# Patient Record
Sex: Male | Born: 1945 | Race: White | Hispanic: No | Marital: Married | Smoking: Never smoker
Health system: Southern US, Community
[De-identification: ages and names within clinical notes are randomized; demographics above are authoritative.]

## PROBLEM LIST (undated history)

## (undated) DIAGNOSIS — I2510 Atherosclerotic heart disease of native coronary artery without angina pectoris: Secondary | ICD-10-CM

## (undated) DIAGNOSIS — I10 Essential (primary) hypertension: Secondary | ICD-10-CM

## (undated) DIAGNOSIS — C61 Malignant neoplasm of prostate: Secondary | ICD-10-CM

## (undated) DIAGNOSIS — E785 Hyperlipidemia, unspecified: Secondary | ICD-10-CM

## (undated) DIAGNOSIS — E039 Hypothyroidism, unspecified: Secondary | ICD-10-CM

## (undated) DIAGNOSIS — IMO0002 Reserved for concepts with insufficient information to code with codable children: Secondary | ICD-10-CM

## (undated) HISTORY — PX: CORONARY ARTERY BYPASS GRAFT: SHX141

## (undated) HISTORY — DX: Hyperlipidemia, unspecified: E78.5

## (undated) HISTORY — DX: Malignant neoplasm of prostate: C61

## (undated) HISTORY — DX: Reserved for concepts with insufficient information to code with codable children: IMO0002

## (undated) HISTORY — DX: Hypothyroidism, unspecified: E03.9

## (undated) HISTORY — DX: Atherosclerotic heart disease of native coronary artery without angina pectoris: I25.10

## (undated) HISTORY — PX: PROSTATECTOMY: SHX69

## (undated) HISTORY — DX: Essential (primary) hypertension: I10

---

## 2005-03-03 ENCOUNTER — Ambulatory Visit: Payer: Self-pay | Admitting: Gastroenterology

## 2005-04-20 ENCOUNTER — Ambulatory Visit: Payer: Self-pay | Admitting: Gastroenterology

## 2005-04-20 ENCOUNTER — Ambulatory Visit (HOSPITAL_COMMUNITY): Admission: RE | Admit: 2005-04-20 | Discharge: 2005-04-20 | Payer: Self-pay | Admitting: Gastroenterology

## 2005-05-19 ENCOUNTER — Inpatient Hospital Stay (HOSPITAL_COMMUNITY): Admission: RE | Admit: 2005-05-19 | Discharge: 2005-05-21 | Payer: Self-pay | Admitting: Urology

## 2005-05-19 ENCOUNTER — Encounter (INDEPENDENT_AMBULATORY_CARE_PROVIDER_SITE_OTHER): Payer: Self-pay | Admitting: *Deleted

## 2006-07-06 ENCOUNTER — Ambulatory Visit: Payer: Self-pay | Admitting: Cardiovascular Disease

## 2006-07-06 ENCOUNTER — Inpatient Hospital Stay (HOSPITAL_COMMUNITY): Admission: EM | Admit: 2006-07-06 | Discharge: 2006-07-10 | Payer: Self-pay | Admitting: Emergency Medicine

## 2006-07-26 ENCOUNTER — Ambulatory Visit: Payer: Self-pay | Admitting: Internal Medicine

## 2006-07-27 ENCOUNTER — Ambulatory Visit: Payer: Self-pay

## 2006-07-27 ENCOUNTER — Encounter (HOSPITAL_COMMUNITY): Admission: RE | Admit: 2006-07-27 | Discharge: 2006-10-25 | Payer: Self-pay | Admitting: Internal Medicine

## 2006-07-31 ENCOUNTER — Ambulatory Visit (HOSPITAL_COMMUNITY): Admission: RE | Admit: 2006-07-31 | Discharge: 2006-07-31 | Payer: Self-pay | Admitting: Internal Medicine

## 2006-10-05 ENCOUNTER — Ambulatory Visit: Payer: Self-pay | Admitting: Internal Medicine

## 2006-10-09 ENCOUNTER — Ambulatory Visit: Payer: Self-pay | Admitting: Internal Medicine

## 2007-01-23 ENCOUNTER — Ambulatory Visit: Payer: Self-pay | Admitting: Internal Medicine

## 2007-01-23 LAB — CONVERTED CEMR LAB
ALT: 19 units/L (ref 0–40)
Albumin: 4 g/dL (ref 3.5–5.2)
Alkaline Phosphatase: 56 units/L (ref 39–117)
BUN: 9 mg/dL (ref 6–23)
Bilirubin, Direct: 0.2 mg/dL (ref 0.0–0.3)
CO2: 30 meq/L (ref 19–32)
Calcium: 9.2 mg/dL (ref 8.4–10.5)
Cholesterol: 106 mg/dL (ref 0–200)
Creatinine, Ser: 0.8 mg/dL (ref 0.4–1.5)
GFR calc Af Amer: 128 mL/min
GFR calc non Af Amer: 106 mL/min
Glucose, Bld: 91 mg/dL (ref 70–99)
HDL: 37.1 mg/dL — ABNORMAL LOW (ref >39.0)
LDL Cholesterol: 44 mg/dL (ref 0–99)
Potassium: 4.8 meq/L (ref 3.5–5.1)
Sodium: 140 meq/L (ref 135–145)
Total Bilirubin: 1.2 mg/dL (ref 0.3–1.2)
Triglycerides: 126 mg/dL (ref 0–149)
VLDL: 25 mg/dL (ref 0–40)

## 2007-02-06 ENCOUNTER — Encounter: Payer: Self-pay | Admitting: Internal Medicine

## 2007-02-06 ENCOUNTER — Ambulatory Visit: Payer: Self-pay

## 2007-07-12 IMAGING — CR DG CHEST 1V PORT
1 series · 1 of 1 positions shown · non-contrast
Comparison: 07/07/06.

CLINICAL DATA: Status post CABG.
 PORTABLE CHEST - 1 VIEW - 07/08/06 AT 2052 HOURS:

[view not recorded]
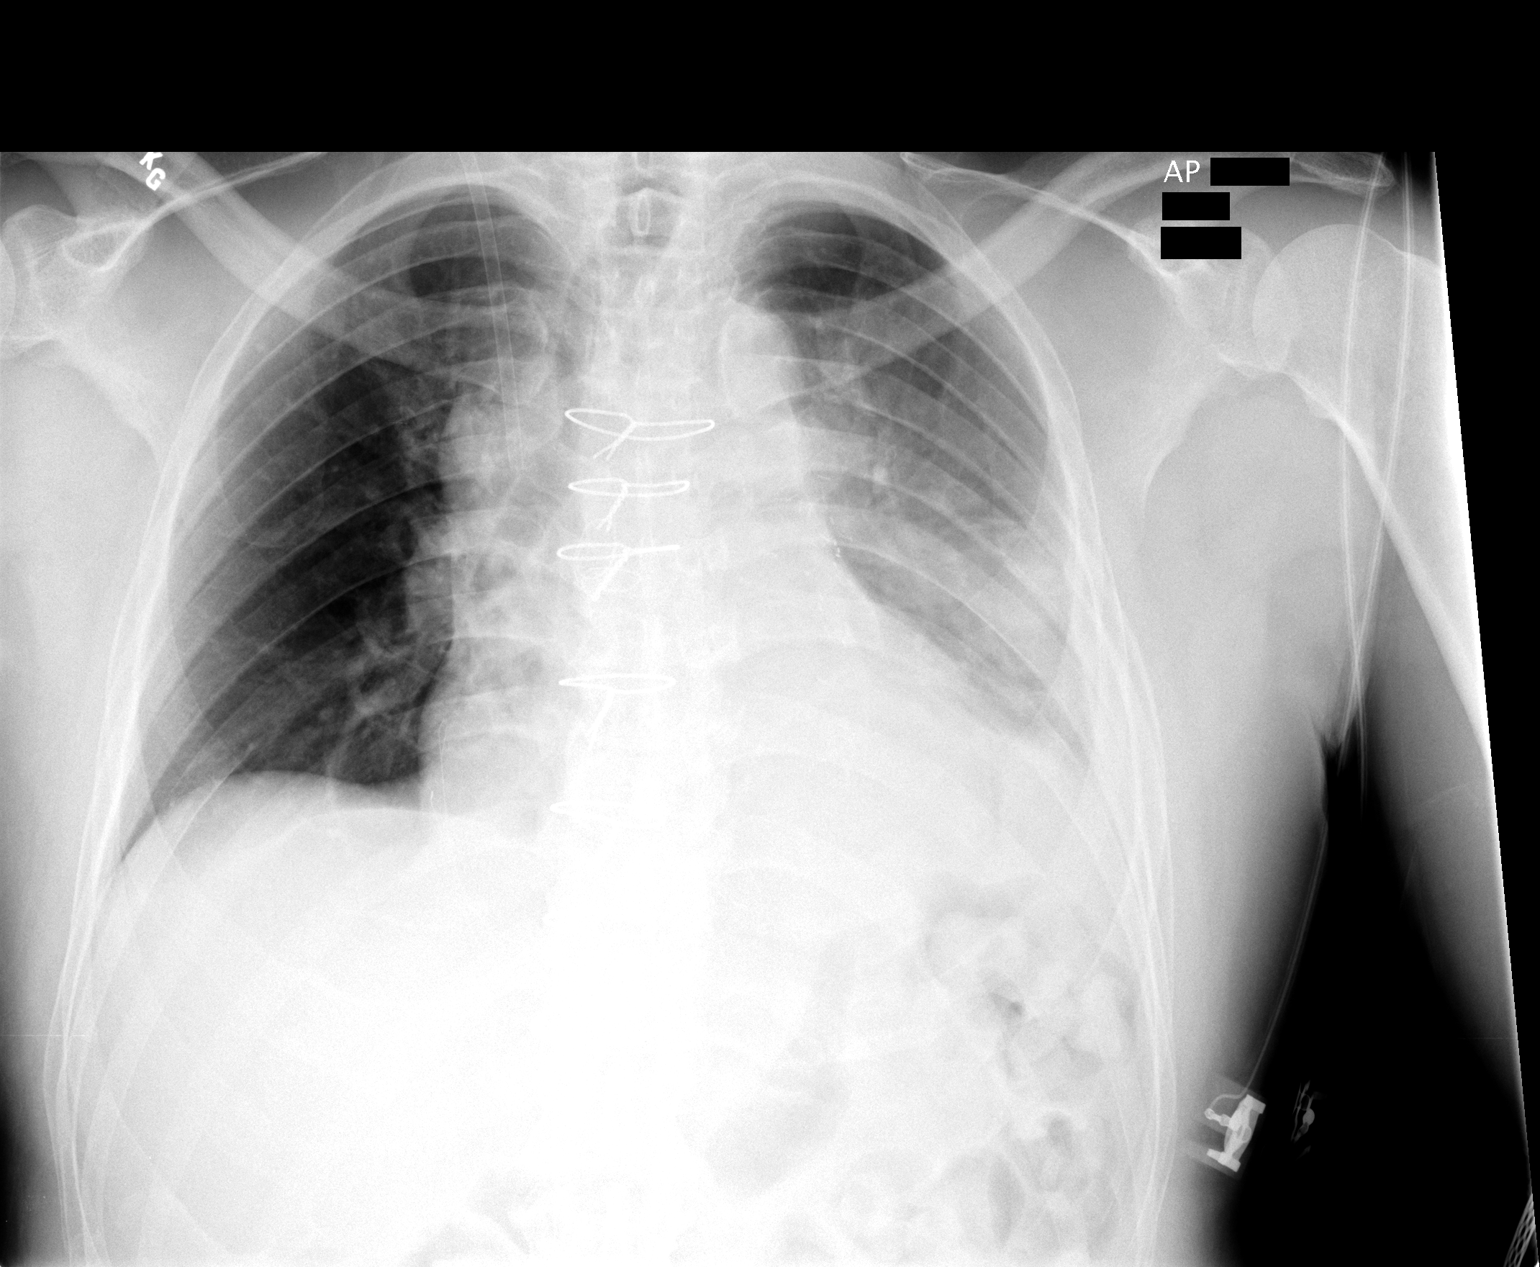

[1 of 1 positions shown; findings below may reference images not displayed]

FINDINGS: The Swan-Ganz catheter, left chest tube, aortic balloon pump, and mediastinal drains have been removed.  No pneumothorax.  Increase in left lower lobe atelectasis since the prior examination.  No edema.  Stable heart size.
IMPRESSION: Increase in left lower lobe atelectasis.  No pneumothorax.

## 2007-08-15 ENCOUNTER — Ambulatory Visit: Payer: Self-pay | Admitting: Internal Medicine

## 2007-08-15 LAB — CONVERTED CEMR LAB
AST: 29 units/L (ref 0–37)
Albumin: 3.8 g/dL (ref 3.5–5.2)
Alkaline Phosphatase: 49 units/L (ref 39–117)
Glucose, Bld: 95 mg/dL (ref 70–99)
Potassium: 4.4 meq/L (ref 3.5–5.1)
Total Bilirubin: 0.8 mg/dL (ref 0.3–1.2)
Triglycerides: 121 mg/dL (ref 0–149)

## 2008-04-13 ENCOUNTER — Encounter: Admission: RE | Admit: 2008-04-13 | Discharge: 2008-04-13 | Payer: Self-pay | Admitting: Family Medicine

## 2008-05-12 ENCOUNTER — Encounter
Admission: RE | Admit: 2008-05-12 | Discharge: 2008-07-07 | Payer: Self-pay | Admitting: Physical Medicine and Rehabilitation

## 2008-08-20 ENCOUNTER — Ambulatory Visit: Payer: Self-pay | Admitting: Internal Medicine

## 2008-08-20 LAB — CONVERTED CEMR LAB
ALT: 33 units/L (ref 0–53)
Alkaline Phosphatase: 45 units/L (ref 39–117)
Bilirubin, Direct: 0.1 mg/dL (ref 0.0–0.3)
CO2: 30 meq/L (ref 19–32)
Calcium: 9.4 mg/dL (ref 8.4–10.5)
Chloride: 108 meq/L (ref 96–112)
Creatinine, Ser: 0.8 mg/dL (ref 0.4–1.5)
GFR calc Af Amer: 127 mL/min
GFR calc non Af Amer: 105 mL/min
Glucose, Bld: 101 mg/dL — ABNORMAL HIGH (ref 70–99)
HDL: 31.3 mg/dL — ABNORMAL LOW (ref >39.0)
Potassium: 4.7 meq/L (ref 3.5–5.1)
Sodium: 142 meq/L (ref 135–145)
Total Protein: 6.9 g/dL (ref 6.0–8.3)
VLDL: 13 mg/dL (ref 0–40)

## 2009-04-27 ENCOUNTER — Encounter (INDEPENDENT_AMBULATORY_CARE_PROVIDER_SITE_OTHER): Payer: Self-pay

## 2009-04-27 ENCOUNTER — Encounter: Payer: Self-pay | Admitting: Internal Medicine

## 2009-04-27 LAB — CONVERTED CEMR LAB
HDL: 45 mg/dL
Triglyceride fasting, serum: 55 mg/dL

## 2009-05-05 ENCOUNTER — Encounter (INDEPENDENT_AMBULATORY_CARE_PROVIDER_SITE_OTHER): Payer: Self-pay

## 2009-07-21 ENCOUNTER — Ambulatory Visit: Payer: Self-pay | Admitting: Internal Medicine

## 2009-07-21 DIAGNOSIS — E782 Mixed hyperlipidemia: Secondary | ICD-10-CM | POA: Insufficient documentation

## 2009-07-21 DIAGNOSIS — I10 Essential (primary) hypertension: Secondary | ICD-10-CM | POA: Insufficient documentation

## 2009-07-21 DIAGNOSIS — R634 Abnormal weight loss: Secondary | ICD-10-CM

## 2009-07-21 DIAGNOSIS — I2510 Atherosclerotic heart disease of native coronary artery without angina pectoris: Secondary | ICD-10-CM

## 2009-10-02 ENCOUNTER — Telehealth: Payer: Self-pay | Admitting: Internal Medicine

## 2010-06-17 ENCOUNTER — Encounter: Payer: Self-pay | Admitting: Internal Medicine

## 2010-06-28 ENCOUNTER — Encounter: Payer: Self-pay | Admitting: Internal Medicine

## 2010-07-14 ENCOUNTER — Ambulatory Visit: Payer: Self-pay | Admitting: Internal Medicine

## 2010-07-24 ENCOUNTER — Encounter: Admission: RE | Admit: 2010-07-24 | Discharge: 2010-07-24 | Payer: Self-pay | Admitting: Family Medicine

## 2010-08-03 ENCOUNTER — Encounter (INDEPENDENT_AMBULATORY_CARE_PROVIDER_SITE_OTHER): Payer: Self-pay | Admitting: *Deleted

## 2010-08-06 ENCOUNTER — Ambulatory Visit: Payer: Self-pay | Admitting: Gastroenterology

## 2010-08-20 ENCOUNTER — Ambulatory Visit: Payer: Self-pay | Admitting: Gastroenterology

## 2010-11-15 ENCOUNTER — Telehealth: Payer: Self-pay | Admitting: Internal Medicine

## 2011-01-02 ENCOUNTER — Encounter: Payer: Self-pay | Admitting: Surgery

## 2011-01-11 NOTE — Assessment & Plan Note (Signed)
Summary: f1y  Medications Added SIMVASTATIN 20 MG TABS (SIMVASTATIN) Take one tablet by mouth daily at bedtime      Allergies Added: NKDA  Visit Type:  Follow-up Primary Provider:  Dr Vernon Prey  CC:  no complaints.  History of Present Illness: [name] [name] is a 65 year old male with history of coronary artery disease status post bypass surgery in 2007.  He also has a history of hypertension, hyperlipidemia, hypothyroidism, and a history of prostate cancer.   He returns today for a routine followup. Overall, he is doing very well.  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  He works very hard with no chest pain or shortness of breath.  However he stopped his walking program and gianed 10 pounds. He does note some occasional soreness in his chest like an arthritis-type pain, this is chronic.  main problem is neck pain. Had MRI which showed cervical disks. He has been taking all his medications without difficulty.   Cholesterol in may TC 111 HDL 47 LDL 42 TG 111  Current Medications (verified): 1)  Carvedilol 3.125 Mg Tabs (Carvedilol) .... Take One Tablet By Mouth Twice A Day 2)  Lisinopril 10 Mg Tabs (Lisinopril) .... Take One Tablet By Mouth Daily 3)  Synthroid 50 Mcg Tabs (Levothyroxine Sodium) .... Take 1 Tablet By Mouth Once A Day 4)  Simvastatin 40 Mg Tabs (Simvastatin) .... Take 1/2 Tablet By Mouth Daily At Bedtime 5)  Niaspan 1000 Mg Cr-Tabs (Niacin (Antihyperlipidemic)) .... Take 1 Tablet By Mouth At Bedtime 6)  Aspirin Ec 325 Mg Tbec (Aspirin) .... Take One Tablet By Mouth Daily  Allergies (verified): No Known Drug Allergies  Past History:  Past Medical History: CAD s/p CABG 2007 HTN Hyperlipidemia Prostate CA s/p resection Hypothyroidism Cervical disk disease  Review of Systems       As per HPI and past medical history; otherwise all systems negative.   Vital Signs:  Patient profile:   65 year old male Height:      64  inches Weight:      148 pounds BMI:     25.50 Pulse rate:   60 / minute BP sitting:   126 / 68  (right arm) Cuff size:   regular  Vitals Entered By: Hardin Negus, RMA (July 14, 2010 4:17 PM)  Physical Exam  General:  Slight build. Well appearing. no resp difficulty HEENT: normal Neck: supple. no JVD. Carotids 2+ bilat; notbruits. No lymphadenopathy or thryomegaly appreciated. Cor: PMI nondisplaced. Regular rate & rhythm. No rubs, gallops, murmur. Lungs: clear Abdomen: soft, nontender, nondistended.Peri Jefferson bowel sounds. Extremities: no cyanosis, clubbing, rash, edema Neuro: alert & orientedx3, cranial nerves grossly intact. moves all 4 extremities w/o difficulty. affect pleasant    Impression & Recommendations:  Problem # 1:  CORONARY ATHEROSCLEROSIS NATIVE CORONARY ARTERY (ICD-414.01) Stable. No evidence of ischemia. Continue current regimen. Encouraged him to restart his walking program.   Problem # 2:  HYPERTENSION, BENIGN (ICD-401.1) Blood pressure well controlled. Continue current regimen.  Problem # 3:  HYPERLIPIDEMIA, MIXED (ICD-272.2) Lipids look great. LDL at goal. We discussed the AIM-HIGH trial with Niaspan and lack of apparent cardiac benefit. At this point, he prefers to keep taking it.   Other Orders: EKG w/ Interpretation (93000)  Patient Instructions: 1)  Your physician wants you to follow-up in: 1 year. You will receive a reminder letter in the mail two months in advance. If you don't receive a letter, please call our  office to schedule the follow-up appointment. Prescriptions: NIASPAN 1000 MG CR-TABS (NIACIN (ANTIHYPERLIPIDEMIC)) Take 1 tablet by mouth at bedtime  #90 x 3   Entered by:   Meredith Staggers, RN   Authorized by:   Dolores Patty, MD, [facility]   Signed by:   Meredith Staggers, RN on 07/14/2010   Method used:   Faxed to ...       [facility] (mail-order)             , [place]         Ph:        Fax: [phone]   RxID:   [id number] LISINOPRIL  10 MG TABS (LISINOPRIL) Take one tablet by mouth daily  #90 x 3   Entered by:   Meredith Staggers, RN   Authorized by:   Dolores Patty, MD, [facility]   Signed by:   Meredith Staggers, RN on 07/14/2010   Method used:   Faxed to ...       [facility] (mail-order)             , [place]         Ph:        Fax: [phone]   RxID:   [id number] CARVEDILOL 3.125 MG TABS (CARVEDILOL) Take one tablet by mouth twice a day  #180 x 3   Entered by:   Meredith Staggers, RN   Authorized by:   Dolores Patty, MD, [facility]   Signed by:   Meredith Staggers, RN on 07/14/2010   Method used:   Faxed to ...       [facility] (mail-order)             , [place]         Ph:        Fax: [phone]   RxID:   [id number] SIMVASTATIN 20 MG TABS (SIMVASTATIN) Take one tablet by mouth daily at bedtime  #90 x 3   Entered by:   Meredith Staggers, RN   Authorized by:   Dolores Patty, MD, [facility]   Signed by:   Meredith Staggers, RN on 07/14/2010   Method used:   Faxed to ...       [facility] (mail-order)             , [place]         Ph:        Fax: [phone]   RxID:   [id number]

## 2011-01-11 NOTE — Procedures (Signed)
Summary: Colonoscopy  Patient: [name] [name] Note: All result statuses are Final unless otherwise noted.  Tests: (1) Colonoscopy (COL)   COL Colonoscopy           DONE     [facility]     [address]     [place], [place]  [address]           COLONOSCOPY PROCEDURE REPORT           PATIENT:  [name] [name], [name] [name]  MR#:  [id number]     BIRTHDATE:  06-01-1946, 63 yrs. old  GENDER:  male           ENDOSCOPIST:  Barbette Hair. Arlyce Dice, MD     Referred by:           PROCEDURE DATE:  08/20/2010     PROCEDURE:  Diagnostic Colonoscopy     ASA CLASS:  Class II     INDICATIONS:  1) Routine Risk Screening           MEDICATIONS:   Fentanyl 75 mcg IV, Versed 8 mg IV           DESCRIPTION OF PROCEDURE:   After the risks benefits and     alternatives of the procedure were thoroughly explained, informed     consent was obtained.  Digital rectal exam was performed and     revealed no abnormalities.   The LB [id number] endoscope     was introduced through the anus and advanced to the cecum, which     was identified by the ileocecal valve, without limitations.  The     quality of the prep was excellent, using MoviPrep.  The instrument     was then slowly withdrawn as the colon was fully examined.     <<PROCEDUREIMAGES>>           FINDINGS:  Mild diverticulosis was found in the sigmoid colon (see     image12).  This was otherwise a normal examination of the colon     (see image2, image3, image4, image5, image6, image8, image10,     image14, and image15).   Retroflexed views in the rectum revealed     no abnormalities.    The time to cecum =  2.75  minutes. The scope     was then withdrawn (time =  6.25  min) from the patient and the     procedure completed.           COMPLICATIONS:  None           ENDOSCOPIC IMPRESSION:     1) Mild diverticulosis in the sigmoid colon     2) Otherwise normal examination     RECOMMENDATIONS:     1) Continue current colorectal screening recommendations  for     "routine risk" patients with a repeat colonoscopy in 10 years.           REPEAT EXAM:  In 10 year(s) for Colonoscopy.           ______________________________     Barbette Hair. Arlyce Dice, MD           CC: Rudi Heap, MD           n.     Rosalie Doctor:   Barbette Hair. Kaplan at 08/20/2010 10:36 AM           Cherre Robins, [id number]  Note: An exclamation mark (!) indicates a result that was not dispersed into the flowsheet. Document Creation Date:  08/20/2010 10:37 AM _______________________________________________________________________  (1) Order result status: Final Collection or observation date-time: 08/20/2010 10:32 Requested date-time:  Receipt date-time:  Reported date-time:  Referring Physician:   Ordering Physician: Melvia Heaps [id number]) Specimen Source:  Source: Launa Grill Order Number: [id number] Lab site:   Appended Document: Colonoscopy    Clinical Lists Changes  Observations: Added new observation of COLONNXTDUE: 08/2020 (08/20/2010 13:16)

## 2011-01-11 NOTE — Miscellaneous (Signed)
Summary: DIRECT COLON SCREEN/Previsit prep/RM  Clinical Lists Changes  Medications: Added new medication of MOVIPREP 100 GM  SOLR (PEG-KCL-NACL-NASULF-NA ASC-C) As per prep instructions. - Signed Rx of MOVIPREP 100 GM  SOLR (PEG-KCL-NACL-NASULF-NA ASC-C) As per prep instructions.;  #1 x 0;  Signed;  Entered by: Sherren Kerns RN;  Authorized by: Louis Meckel MD;  Method used: [facility] to [facility] [phone]*, [address] [place], [place], [place]  [address], Ph: [phone] or [phone], Fax: [phone] Observations: Added new observation of ALLERGY REV: Done (08/06/2010 14:36)    Prescriptions: MOVIPREP 100 GM  SOLR (PEG-KCL-NACL-NASULF-NA ASC-C) As per prep instructions.  #1 x 0   Entered by:   Sherren Kerns RN   Authorized by:   Louis Meckel MD   Signed by:   Sherren Kerns RN on 08/06/2010   Method used:   Electronically to        [facility] [phone]* (retail)       [address]       [place], [place]  [address]       Ph: [phone] or [phone]       Fax: [phone]   RxID:   [id number]

## 2011-01-11 NOTE — Letter (Signed)
Summary: Custom - Lipid  [facility], Main Office  [address]   [place], [place] [address]   Phone: [phone]  Fax: [phone]     June 28, 2010 MRN: [id number]   [name] [name] [address] [place], [place]  [address]   Dear Mr. [name] [name],  We have reviewed your cholesterol results.  They are as follows:     Total Cholesterol:    111 (Desirable: less than 200)       HDL  Cholesterol:     47  (Desirable: greater than 40 for men and 50 for women)       LDL Cholesterol:       42  (Desirable: less than 100 for low risk and less than 70 for moderate to high risk)       Triglycerides:       111  (Desirable: less than 150)  Our recommendations include:  Looks good, continue current medications.   Call our office at the number listed above if you have any questions.  Lowering your LDL cholesterol is important, but it is only one of a large number of "risk factors" that may indicate that you are at risk for heart disease, stroke or other complications of hardening of the arteries.  Other risk factors include:   A.  Cigarette Smoking* B.  High Blood Pressure* C.  Obesity* D.   Low HDL Cholesterol (see yours above)* E.   Diabetes Mellitus (higher risk if your is uncontrolled) F.  Family history of premature heart disease G.  Previous history of stroke or cardiovascular disease    *These are risk factors YOU HAVE CONTROL OVER.  For more information, visit .  There is now evidence that lowering the TOTAL CHOLESTEROL AND LDL CHOLESTEROL can reduce the risk of heart disease.  The American Heart Association recommends the following guidelines for the treatment of elevated cholesterol:  1.  If there is now current heart disease and less than two risk factors, TOTAL CHOLESTEROL should be less than 200 and LDL CHOLESTEROL should be less than 100. 2.  If there is current heart disease or two or more risk factors, TOTAL CHOLESTEROL should be less than 200 and LDL CHOLESTEROL  should be less than 70.  A diet low in cholesterol, saturated fat, and calories is the cornerstone of treatment for elevated cholesterol.  Cessation of smoking and exercise are also important in the management of elevated cholesterol and preventing vascular disease.  Studies have shown that 30 to 60 minutes of physical activity most days can help lower blood pressure, lower cholesterol, and keep your weight at a healthy level.  Drug therapy is used when cholesterol levels do not respond to therapeutic lifestyle changes (smoking cessation, diet, and exercise) and remains unacceptably high.  If medication is started, it is important to have you levels checked periodically to evaluate the need for further treatment options.  Thank you,    [employer] Team

## 2011-01-11 NOTE — Progress Notes (Signed)
Summary: rx refill   Phone Note Refill Request Call back at Home Phone [phone] Message from:  Patient on November 15, 2010 3:14 PM  Refills Requested: Medication #1:  LISINOPRIL 10 MG TABS Take one tablet by mouth daily [facility] [phone]   Method Requested: Telephone to Pharmacy Initial call taken by: Roe Coombs,  November 15, 2010 3:15 PM    Prescriptions: LISINOPRIL 10 MG TABS (LISINOPRIL) Take one tablet by mouth daily  #90 x 3   Entered by:   Hardin Negus, RMA   Authorized by:   Dolores Patty, MD, [facility]   Signed by:   Hardin Negus, RMA on 11/16/2010   Method used:   Faxed to ...       [facility] (mail-order)             , [place]         Ph:        Fax: [phone]   RxID:   [id number]

## 2011-01-11 NOTE — Letter (Signed)
Summary: [facility] Instructions  [facility]  [address] [place], [place] [address]   Phone: [phone]  Fax: [phone]       RUSHTON EARLY    05-25-1960    MRN: [id number]        Procedure Day Dorna Bloom:  Farrell Ours  08/20/10     Arrival Time:  9:00AM     Procedure Time:  10:00AM     Location of Procedure:                    _ X_  [facility] (4th Floor)                       PREPARATION FOR COLONOSCOPY WITH MOVIPREP   Starting 5 days prior to your procedure 9/4/11do not eat nuts, seeds, popcorn, corn, beans, peas,  salads, or any raw vegetables.  Do not take any fiber supplements (e.g. Metamucil, Citrucel, and Benefiber).  THE DAY BEFORE YOUR PROCEDURE         DATE: 08/19/10   DAY: THURSDAY  1.  Drink clear liquids the entire day-NO SOLID FOOD  2.  Do not drink anything colored red or purple.  Avoid juices with pulp.  No orange juice.  3.  Drink at least 64 oz. (8 glasses) of fluid/clear liquids during the day to prevent dehydration and help the prep work efficiently.  CLEAR LIQUIDS INCLUDE: Water Jello Ice Popsicles Tea (sugar ok, no milk/cream) Powdered fruit flavored drinks Coffee (sugar ok, no milk/cream) Gatorade Juice: apple, white grape, white cranberry  Lemonade Clear bullion, consomm, broth Carbonated beverages (any kind) Strained chicken noodle soup Hard Candy                             4.  In the morning, mix first dose of MoviPrep solution:    Empty 1 Pouch A and 1 Pouch B into the disposable container    Add lukewarm drinking water to the top line of the container. Mix to dissolve    Refrigerate (mixed solution should be used within 24 hrs)  5.  Begin drinking the prep at 5:00 p.m. The MoviPrep container is divided by 4 marks.   Every 15 minutes drink the solution down to the next mark (approximately 8 oz) until the full liter is complete.   6.  Follow completed prep with 16 oz of clear liquid of your choice (Nothing red  or purple).  Continue to drink clear liquids until bedtime.  7.  Before going to bed, mix second dose of MoviPrep solution:    Empty 1 Pouch A and 1 Pouch B into the disposable container    Add lukewarm drinking water to the top line of the container. Mix to dissolve    Refrigerate  THE DAY OF YOUR PROCEDURE      DATE: 08/20/10  DAY: FRIDAY  Beginning at 5:00AM (5 hours before procedure):         1. Every 15 minutes, drink the solution down to the next mark (approx 8 oz) until the full liter is complete.  2. Follow completed prep with 16 oz. of clear liquid of your choice.    3. You may drink clear liquids until 8:00AM (2 HOURS BEFORE PROCEDURE).   MEDICATION INSTRUCTIONS  Unless otherwise instructed, you should take regular prescription medications with a small sip of water   as early as possible the morning of  your procedure.   Additional medication instructions: n/a         OTHER INSTRUCTIONS  You will need a responsible adult at least 63 years of age to accompany you and drive you home.   This person must remain in the waiting room during your procedure.  Wear loose fitting clothing that is easily removed.  Leave jewelry and other valuables at home.  However, you may wish to bring a book to read or  an iPod/MP3 player to listen to music as you wait for your procedure to start.  Remove all body piercing jewelry and leave at home.  Total time from sign-in until discharge is approximately 2-3 hours.  You should go home directly after your procedure and rest.  You can resume normal activities the  day after your procedure.  The day of your procedure you should not:   Drive   Make legal decisions   Operate machinery   Drink alcohol   Return to work  You will receive specific instructions about eating, activities and medications before you leave.    The above instructions have been reviewed and explained to me by   Sherren Kerns RN  August 06, 2010 3:15  PM    I fully understand and can verbalize these instructions _____________________________ Date _________

## 2011-04-26 NOTE — Assessment & Plan Note (Signed)
[facility]                            CARDIOLOGY OFFICE NOTE   NAME:[name], [name] BUTLER                        MRN:          [id number]  DATE:08/15/2007                            DOB:          1946-08-22    PRIMARY CARE Teasia Zapf:  Lindaann Pascal at [facility].   INTERVAL HISTORY:  [name] [name] is a 65 year old male with a history of  coronary artery disease status post bypass surgery in July 2007.  He  also has a history of hypertension, hyperlipidemia, hypothyroidism, and  a history of prostate cancer.  He returns today for routine followup.  He is doing well.  He works hard at his job without any chest pain or  shortness of breath.  He is walking about a mile to a mile and a half a  day, and also riding his bike several miles with his grand kids.  He has  had some soreness at his sternotomy site when he tries to throw a  baseball.  He feels like he just cannot stretch it out, but he has not  had any angina, and no heart failure symptoms.  He denies any syncope or  presyncope.   CURRENT MEDICATIONS:  1. Levoxyl 50 mcg a day.  2. Simvastatin 40 a day.  3. Aspirin 81.  4. Lisinopril 10 a day.  5. Metoprolol 25 b.i.d.   PHYSICAL EXAMINATION:  He is well appearing, in no acute distress.  He  ambulates around the clinic without any respiratory difficulty.  Blood pressure is 136/76.  Heart rate is 54.  Weight is 153.  HEENT:  Normal.  NECK:  Supple.  No JVD.  Carotids are 2+ bilaterally without any bruits.  There is no lymphadenopathy or thyromegaly.  CARDIAC:  PMI is non-displaced.  He is bradycardic and regular.  No  murmurs, rubs, or gallops.  LUNGS:  Clear.  ABDOMEN:  Soft, nontender and non-distended.  No hepatosplenomegaly.  No  bruits.  No masses.  Good bowel sounds.  EXTREMITIES:  Warm with no cyanosis, clubbing, or edema.  Good pulses.  No rash.  NEUROLOGIC:  Alert and oriented x3.  Cranial nerves 2 through 12 are  intact.  He moves all 4 extremities without difficulty.  Affect is  mildly flattened.  EKG:  Shows sinus bradycardia, rate of 52.  No significant ST-T wave  abnormalities.   ASSESSMENT AND PLAN:  1. Coronary artery disease status post bypass.  He is doing well.  No      evidence of angina.  He does have some soreness at his sternotomy      site, which is chronic.  2. Hypertension.  Blood pressure is elevated today.  Given his      bradycardia, we will switch his metoprolol over to Coreg 3.125      b.i.d. to see if we can get some more blood pressure to less      bradycardia.  We may also need to consider increasing his      lisinopril.  He can follow up with his primary care physician.  3. Hyperlipidemia.  Check lipids today.  4. Bradycardia.  This is stable.  It is asymptomatic.  We are      switching him over to Coreg.  We may need to discontinue his beta      blocker all together in the future should he become symptomatic.   DISPOSITION:  Return to clinic in 9 months for routine followup.  We  will check lipids today.     Bevelyn Buckles. Bensimhon, MD  Electronically Signed    DRB/MedQ  DD: 08/15/2007  DT: 08/15/2007  Job #: [id number]

## 2011-04-26 NOTE — Assessment & Plan Note (Signed)
[facility]                            CARDIOLOGY OFFICE NOTE   NAME:Albertsen, ABID BOLLA                        MRN:          [id number]  DATE:08/20/2008                            DOB:          [birth year]    PRIMARY CARE PHYSICIAN:  Lindaann Pascal, PA-C at [facility].   INTERVAL HISTORY:  [name] [name] is a 65 year old male with history of coronary  artery disease status post bypass surgery in 2007.  He also has a  history of hypertension, hyperlipidemia, hypothyroidism, and a history  of prostate cancer.   He returns today for a routine followup.  Overall, he is doing very  well.  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  He does note  some occasional soreness in his chest like an arthritis-type pain, this  is chronic.  He has been taking all his medications without difficulty.   REVIEW OF SYSTEMS:  Negative except as above.   CURRENT MEDICATIONS:  1. Levoxyl 50 mcg day.  2. Simvastatin 40 a day.  3. Aspirin 81.  4. Lisinopril 10 a day.  5. Coreg 3.125 b.i.d.  6. Niaspan 500 at bedtime.   PHYSICAL EXAMINATION:  GENERAL:  He is well appearing, in no acute  distress, ambulatory around the clinic, without any respiratory  difficulty.  VITAL SIGNS:  Blood pressure is 122/80, heart rate is 58, weight is 145.  HEENT:  Normal.  NECK:  Supple.  No JVD.  Carotids are 2+ bilaterally without any bruits.  There is no lymphadenopathy or thyromegaly.  CARDIAC:  PMI is nondisplaced.  He is bradycardic and regular.  No  murmurs, rubs, or gallops.  LUNGS:  Clear.  ABDOMEN:  Soft, nontender, and nondistended.  No hepatosplenomegaly, no  bruits, no masses.  Good bowel sounds.  EXTREMITIES:  Warm with no cyanosis, clubbing or edema.  No rash.  NEURO:  Alert and oriented x3.  Cranial nerves II-XII intact.  Moves all  4 extremities without difficulty.  Affect is  normal.   EKG shows sinus bradycardia at a rate of 58.  No ST-T wave  abnormalities.   ASSESSMENT AND PLAN:  1. Coronary artery disease status post bypass surgery.  He is doing      well.  No evidence of angina.  He does have some soreness of his      sternotomy site, which is chronic, likely related to his surgery.  2. Hypertension.  Blood pressure well controlled.  3. Hyperlipidemia.  He does have low HDL.  We will continue statin and      Niaspan.  Get a lipid panel today.  Goal LDL is 70.  We may need      also titrate his Niaspan up if his HDL remains low.     Bevelyn Buckles. Bensimhon, MD  Electronically Signed    DRB/MedQ  DD: 08/20/2008  DT: 08/21/2008  Job #: [id number]

## 2011-04-29 NOTE — Assessment & Plan Note (Signed)
[facility]                                 ON-CALL NOTE   NAME:[name], [name]                          MRN:          [id number]  DATE:03/16/2007                            DOB:          08-25-1946    PRIMARY CARDIOLOGIST:  Dr. Clydie Braun.   I received a telephone call through the answering service from Cherre Robins at [phone], I promptly returned the call and spoke with [name]. He was calling regarding high blood pressure. He states he had  just seen Dr. Theotis Burrow on February 12th for a followup visit, blood  pressure was stable at that time at 120/76. Patient was taking  lisinopril 10 mg a day and metoprolol 50 mg b.i.d., apparently Dr.  Theotis Burrow has had the patient cut his metoprolol back to 25 mg b.i.d. and  was to follow up patient in 9 months or sooner if he had any problems.  Patient called on March 15, 2007 at 18:27 stated his blood pressure was  170/100 with a heart rate of 76. [name] [name] states he had been nauseated  and had headache prior to checking his blood pressure and was concerned  that it was elevated. I recommended that he increase his metoprolol back  to the 50 mg b.i.d. and follow up with Dr. Theotis Burrow in the morning, to  make an appointment for reevaluation sooner than 9 months out. [name] [name]  states he would and verbalized understanding of the medication change.     Dorian Pod, ACNP  Electronically Signed    MB/MedQ  DD: 03/16/2007  DT: 03/16/2007  Job #: [id number]

## 2011-04-29 NOTE — Op Note (Signed)
NAMECAVIN, LONGMAN                 ACCOUNT NO.:  [id number]   MEDICAL RECORD NO.:  [id number]          PATIENT TYPE:  INP   LOCATION:  0005                         FACILITY:  [facility]   PHYSICIAN:  Excell Seltzer. Annabell Howells, M.D.    DATE OF BIRTH:  October 09, 1946   DATE OF PROCEDURE:  05/19/2005  DATE OF DISCHARGE:                                 OPERATIVE REPORT   PREOPERATIVE DIAGNOSIS:  Prostate cancer.   POSTOPERATIVE DIAGNOSIS:  Prostate cancer.   PROCEDURE:  Radical prostatectomy with bilateral pelvic lymphadenectomy.   SURGEON:  Excell Seltzer. Annabell Howells, M.D.   ASSISTANT:  Dr. Bailey Mech   ANESTHESIA:  General.   SPECIMEN:  Prostate seminal vesicles, bilateral pelvic lymph nodes.   ESTIMATED BLOOD LOSS:  1000 mL.   DRAINS:  A 20 French Foley catheter and #10 flat, Blake drain.   COMPLICATIONS:  None.   INDICATIONS:  Mr. Timko is a 65 year old white male who was found to have a  nodular prostate and marginal PSA who underwent a prostate ultrasound biopsy  which demonstrated 5% of the left tissue with Gleason 6 adenocarcinoma of  the prostate.  Only 1 of the 6 cores was positive.  He did have a nodule on  the right side of the prostate gland.  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 He has selected radical  prostatectomy.   FINDINGS AND PROCEDURE:  The patient is given a gram of Ancef. He was fitted  with PASOs.  He was taken to the operating room where a general anesthetic  was induced.  His lower abdomen was shaved and prepped with Betadine  solution. He was draped in the usual sterile fashion.  A Foley catheter was  inserted and the bladder was drained.  A lower midline incision was made  with the knife from the pubis to approximately half-way to the umbilicus.  This was carried down through the subcutaneous and musculofascial layers  with the Bovie.  The Bookwalter retractor was placed.  The right and left  pelvic fossa were exposed. A malleable  retractor was placed on the right and  the lymph node dissection was performed with a limited dissection being the  external iliac vein, the circumflex iliac vein, the obturator nerve and the  bifurcation of the iliac arteries.  Hemolock clips were used to control the  vascular and lymphatic channels.   A left pelvic lymph node dissection was then performed in an identical  fashion without complications.  At this point the retractors were  repositioned and the prostatectomy was performed.  The endopelvic fascia was  opened on each side with the tips of the scissors and then lateral  dissection of the prostate was performed bluntly.  The endopelvic fascia was  then elevated on a right-angle on the right and incised proximally toward  the bladder.  This was then repeated on the left.  The Hohenfellner clamp  was then placed beneath the dorsal vein complex.  A #1 Vicryl tie was then  brought around and tied.  An Allis clamp  was used to grasp the edges of the  endopelvic fascia and compress them over the prostate. This was then  oversewn using a 2-0 Vicryl figure-of-eight suture.  At this point the  [facility] was replaced and the dorsal vein complex was divided with a  cautery.  There was some moderate bleeding from the dorsal vein complex that  eventually required multiple figure-of-eight 2-0 Vicryl sutures to  adequately control.   Once this was controlled the neurovascular bundles were dissected off of the  urethra.  A moistened umbilical tape was placed beneath the urethra.  The  anterior urethral wall was divided using a knife on long handle, and the  Foley was then pulled up into the wound, divided and used to apply cephalad  traction and the posterior urethral wall was then divided with scissors.  Gentle dissection was performed to elevate the prostate off the rectal wall  and the lateral pedicles were then taken down using right-angle clips with  care taken to allow the  neurovascular bundle to fall away.  Once the  prostate was elevated sufficiently, the __________ fascia was incised over  the seminal vesicles and ampullae.  The ampullae were dissected out, and  divided after placement of Hemolock clips.  The seminal vesicles were then  dissected out.  Clips were placed across the tips and they were divided.   At this point, we turned our attention to the bladder neck where the bladder  neck was grasped between Allis clamps and opened with the Bovie.  The Foley  catheter balloon was drained and the Foley was brought out from the bladder  neck incision to provide traction on the prostate.  The posterior bladder  neck and lateral attachments were taken down using a tonsil holder for  dissection the Bovie freeing the bladder neck up from the prostate and  seminal vesicles.  Some attention attachments to the seminal vesicles were  then taken down using clips and the specimen was removed from the field.  Inspection revealed some bleeding from a few areas of the bladder neck.  It  was controlled with cautery successfully.  A this point a pack was placed in  the pelvis and bladder neck reconstruction was performed.  The bladder neck  mucosa was everted using interrupted 4-0 chromic sutures and then the  bladder neck was then closed in a tennis racket fashion using a running 2-0  Vicryl stitch.   At this point, the pack was removed from the pelvis.  Hemostasis was felt to  be adequate  A Greenwald sound was placed per urethra and anastomotic  sutures were placed using 2-0 Vicryl.  The sutures were placed at 2, 5, 7,  10, and 12 o'clock.  Once all the sutures were then placed on the urethral  side the Greenwald sound was removed and a fresh Foley catheter, 20-French  was inserted.  The 4 posterior anastomotic sutures were then placed.  The  Foley was then inserted through the bladder neck. Balloon was filled with 15 mL of sterile fluid and the 12 o'clock suture  was placed. The retractors  were released and the bladder neck was brought down to the urethral stump.  The Foley was placed on light traction.  The sutures were tensioned, tied,  and trimmed.   At this point, the bladder was irrigated. No leakage was noted at the  bladder neck.  No blood was noted in the bladder.  A #10 fully flat Blake  drain was placed  through a separate stab wound on the left side of the  incision.  This was secured with a 2-0 silk suture.  The incision was closed  with running #1 PDS.  The subcutaneous tissue was  irrigated. The skin was closed with clips.  A dressing was applied.  The  drain was placed to bulb suction and the Foley was placed to straight  drainage.  The patient's anesthetic was reversed. He was admitted to the  recovery room in stable condition.  There were no complications.       JJW/MEDQ  D:  05/19/2005  T:  05/19/2005  Job:  [id number]   cc:   Lorin Picket A. Gerda Diss, MD  [address], [address]  [place]  [place] [address]  Fax: [phone]

## 2011-04-29 NOTE — Assessment & Plan Note (Signed)
[facility]                            CARDIOLOGY OFFICE NOTE   NAME:Strick, [name] [name]                        MRN:          [id number]  DATE:01/23/2007                            DOB:          07-24-1946    PRIMARY CARE Elaura Calix:  Lindaann Pascal at [facility].   INTERVAL HISTORY:  Mr. Mineer is a very pleasant 65 year old male with a  history of coronary artery disease status post bypass surgery in July of  2007, as well as hypertension, hyperlipidemia, hypothyroidism and  history of prostate cancer.  He returns today for routine follow up.  He  is doing very well.  He works hard at his job without any chest pain or  shortness of breath.  He does continue to walk intermittently.  He  denies any heart failure symptoms.  He does have some numbness at his  sternotomy site.   CURRENT MEDICATIONS:  1. Levoxyl 50 mg a day.  2. Metoprolol 50 b.i.d.  3. Simvastatin 40 a day.  4. Aspirin 81 a day.  5. Lisinopril 10 a day.   PHYSICAL EXAM:  He is well-appearing, no acute distress, ambulatory in  the clinic without any respiratory difficulty.  Blood pressure 120/76,  heart rate is 53, weight is 156.  HEENT:  Sclera anicteric, EOMI.  There are no xanthelasmas.  Mucous  membranes are moist.  Oropharynx clear.  Good dentition.  NECK:  Supple, no JVD.  Carotids are 2+ bilaterally without any bruits.  There is no lymphadenopathy or thyromegaly.  CARDIAC:  He is bradycardic and regular.  No murmurs, rubs or gallops.  LUNGS:  Clear with mildly decreased breath sounds at the left base.  ABDOMEN:  Soft, nontender, nondistended.  No hepatosplenomegaly, no  bruits, no masses.  EXTREMITIES:  Warm with no cyanosis, clubbing or edema.  Good pulses.  NEURO:  Alert and oriented x3, cranial nerves II-XII are intact, moves  all 4 extremities without difficulty.   EKG shows sinus bradycardia with low voltage and no significant ST-T  wave changes.   ASSESSMENT AND PLAN:  1. Coronary artery disease.  This is quite stable.  He is on a good      regimen and we will continue this.  We are clearing him to go back      to work without any restrictions.  2. Hypertension, well controlled.  3. Hyperlipidemia.  Check lipids today.  4. Bradycardia.  Will cut back his metoprolol at 25 b.i.d.  5. Low voltage on EKG.  I suspect this normal for him, but we will get      an echocardiogram just to make sure he does not have a significant      pericardial effusion.   DISPOSITION:  Return to clinic in 9 months for routine follow up.     Bevelyn Buckles. Bensimhon, MD  Electronically Signed    DRB/MedQ  DD: 01/23/2007  DT: 01/23/2007  Job #: [id number]   cc:   Lindaann Pascal

## 2011-04-29 NOTE — Cardiovascular Report (Signed)
NAMEHIAWATHA, [name] [name] NO.:  [id number]   MEDICAL RECORD NO.:  [id number]          PATIENT TYPE:  INP   LOCATION:  2807                         FACILITY:  [facility]   PHYSICIAN:  Micheline Chapman, MD   DATE OF BIRTH:  [birth year]   DATE OF PROCEDURE:  07/06/2006  DATE OF DISCHARGE:                              CARDIAC CATHETERIZATION   PRIMARY CARE Nadya Hopwood:  Lindaann Pascal, P.A. with [facility].   REFERRING PHYSICIAN:  Arvilla Meres, M.D.   This is a cardiac catheterization report, performed by me with Dr. Simona Huh as the proctoring physician.   INDICATIONS FOR PROCEDURE:  [name] [name] is a 65 year old man who presented  with a one month history of crescendo angina that occurred with minimal  exertion.  His symptoms became progressive and he developed resting chest  discomfort that sounded classic for myocardial ischemia and this he was  referred for urgent cardiac catheterization.   PROCEDURES PERFORMED:  Left heart catheterization, selective coronary  angiography, left ventricular angiography and intra-aortic balloon pump  insertion.   DESCRIPTION OF PROCEDURE:  The procedure indications and risks were reviewed  with the patient and consent was obtained.  The patient was prepped and  draped in normal sterile fashion.  The right groin was anesthetized with 1%  Lidocaine and right femoral arterial access was achieved via the modified  Seldinger technique.  A 6 French arterial sheath was placed and #6 [place]  JL4, JR4 and angled pigtail catheters were used.  Following angiographic  images, an intra-aortic balloon pump was inserted due to critical left main  disease and ongoing chest pain.  This was performed by change out over a  wire to a #8 [place] arterial sheath followed by routine introduction of a 34  cm intra-aortic balloon pump without any problems.   FINDINGS:  Opening aortic pressure was 138/82 mmHg.  Left ventricular  systolic pressure was 132 mmHg.  Left ventricular end diastolic pressure was  10 mmHg.   ANGIOGRAPHIC FINDINGS::  The initial images of the left main demonstrate  high grade disease of the distal left main extending into the ostial  circumflex with a 90% ulcerated ostial circumflex lesion.  The remainder of  the left circumflex system is without any significant disease.  There is a  large first marginal as well as an AV groove circumflex that do not show any  obstructive coronary disease.  There is a moderate sized ramus branch that  has high grade ostial disease and appears to be involved in the distal left  mainstem process with an approximate 80% ostial lesion.  The left anterior  descending artery has moderate ostial disease and serial 70% lesions  throughout.  There is a long tubular lesion just proximal to a large second  diagonal branch that is also 70% in severity.  There are two significant  diagonals that branch off of the left anterior descending and they are  without any significant disease.   The right coronary artery is a medium sized vessel with a focal 40% proximal  lesion.  There is no other significant disease in this vessel.  It gives off  two RV marginal branches that are both fairly small and two posterior  lateral branches.  Intracoronary nitroglycerin was given to evaluate the  proximal lesion and it remained in the 30 to 40% range after nitroglycerin.   LEFT VENTRICULOGRAPHY:  The left ventriculography showed a preserved left  ventricular ejection fraction of 65%.   SUMMARY:  This is a 65 year old man with unstable angina and high grade  distal left main disease. He developed ongoing chest discomfort after  performing the left coronary angiogram and we therefore inserted an intra-  aortic balloon pump as described above.  He tolerated this well.  He was  also started on intravenous nitroglycerin and intravenous heparin at that  time and his chest pain resolved.   He had excellent augmentation of his  pressures with the balloon pump.  A cardiovascular surgery consult was  obtained and Dr. Evelene Croon has kindly agreed to evaluate [name] [name] with  plans on operating later today.      Micheline Chapman, MD  Electronically Signed     MDC/MEDQ  D:  07/06/2006  T:  07/06/2006  Job:  [id number]   cc:   Evelene Croon, M.D.  Arvilla Meres, M.D. [facility]  Scott Long, P.A. -[place]

## 2011-04-29 NOTE — Discharge Summary (Signed)
NAMEMATHEAU, ORONA                 ACCOUNT NO.:  [id number]   MEDICAL RECORD NO.:  [id number]          PATIENT TYPE:  INP   LOCATION:  2018                         FACILITY:  [facility]   PHYSICIAN:  Coral Ceo, P.A.     DATE OF BIRTH:  December 04, 1946   DATE OF ADMISSION:  07/06/2006  DATE OF DISCHARGE:                                 DISCHARGE SUMMARY   PRIMARY ADMITTING DIAGNOSIS:  Chest pain.   ADDITIONAL/DISCHARGE DIAGNOSES:  1.  Coronary artery disease with high-grade left main and ostial left      circumflex stenoses with thrombus.  2.  Unstable angina.  3.  Hyperlipidemia.  4.  Hypothyroidism.  5.  History of prostate cancer status post prostatectomy.  6.  Postoperative blood loss anemia.   PROCEDURES PERFORMED:  1.  Cardiac catheterization.  2.  Emergency coronary artery bypass grafting x3 (left internal mammary      artery to the LAD, saphenous vein graft to the diagonal, saphenous vein      graft to the obtuse marginal).  3.  Endoscopic vein harvest left leg.   HISTORY:  The patient is a 65 year old white male with no previous history  of coronary artery disease.  He presented with history of intermittent  exertional chest pain which had been occurring for approximately 1 month.  He feels like the symptoms have been increasing in frequency over the past  few weeks and the intensity of the symptoms has also worsened.  He presented  to [facility] for further evaluation and was seen by [facility]  cardiology and admitted for further workup.   HOSPITAL COURSE:  Mr. Kitagawa underwent cardiac catheterization which showed  high-grade 90% distal left main stenosis extending into the ostium of the  left circumflex.  There is thrombus present in the left main.  The LAD was  heavily diseased in its proximal and midportions with sequential 90%  stenoses.  There was a moderate size diagonal branch.  The left circumflex  had two large marginal branches that communicated.  The right  coronary  artery was a large vessel and there was a 40% nonobstructive stenosis in the  proximal portion.  Left ventricular function was well-preserved.  Because of  high-grade left main stenosis, and presence of thrombus a cardiothoracic  surgery consultation was obtained and the patient was seen by Dr. Evelene Croon.  Dr. Laneta Simmers felt that emergent coronary artery bypass grafting was  indicated to prevent further ischemia or infarction.  The patient had an  intra-aortic balloon pump placed in the cath lab for ongoing chest pain and  chest pain did resolve.  He is taken emergently to the operating room and  underwent CABG x3 as described in detail above.  He tolerated the procedure  well and was transferred to the SICU in stable condition.  Postoperatively  he has done well.  His intra-aortic balloon pump was removed on postop day  #1.  He was extubated shortly after surgery and was remaining  hemodynamically stable on postop day #1 as well.  His chest tubes and  hemodynamic  monitoring devices were removed at that time.  He remained in  the unit for further observation and by postop day #2 was ready for transfer  to the floor.  Overall he has done well postoperatively.  He has been  started on a beta blocker in the dose and titrated upward.  He has remained  mildly tachycardiac and cardiology has advanced his beta-blocker dose to  Lopressor 50 mg b.i.d. at the time of discharge.  He has also had a mild  blood loss anemia which has been stable and has not required treatment.  His  incisions are all healing well.  He has remained afebrile and his other  vital signs have been stable.  On postop day #4 his labs showed a hemoglobin  of 9.6, hematocrit 27.8, platelets 130, white count 10.8, sodium 140,  potassium 4.5, BUN 13, creatinine 0.8.  He is back down to his preoperative  weight and his diuretics have been discontinued.  It is felt that since he  has remained stable and continues to  improve that he will be ready for  discharge home on 07/10/2006.   DISCHARGE MEDICATIONS:  1.  Enteric-coated aspirin 325 mg q.d.  2.  Lopressor 50 mg b.i.d.  3.  Zocor 40 mg q.d.  4.  Levoxyl 50 mcg q.d.  5.  Niferex 150 mg q.d.  6.  Folic acid 1 mg q.d.  7.  Flonase p.r.n.  8.  Oxycodone 1 to 2 q.4-6 h p.r.n. for pain.   DISCHARGE INSTRUCTIONS:  He is asked to refrain from driving, heavy lifting  or strenuous activity.  He may continue ambulating daily and using his  incentive spirometer.  He may shower daily and clean his incisions with soap  and water.  He will continue low-fat, low-sodium diet.   DISCHARGE FOLLOWUP:  He will need to make an appointment see Dr. Gala Romney  in 2 weeks.  He will then follow up with Dr. Laneta Simmers in 3 weeks and our  office will contact him with an appointment.  He will call in the interim if  he experiences any problems or has questions.      Coral Ceo, P.A.     GC/MEDQ  D:  07/10/2006  T:  07/10/2006  Job:  [id number]   cc:   Arvilla Meres, M.D. [facility]  [employer]  [address]  [place]  [place] [address]   Dr. Lindaann Pascal, [place]

## 2011-04-29 NOTE — Assessment & Plan Note (Signed)
[facility]                              CARDIOLOGY OFFICE NOTE   NAME:[name] [name], [name] [name]                        MRN:          [id number]  DATE:10/09/2006                            DOB:          November 14, 1946    PRIMARY CARE Nathanial Arrighi:  Dr. Lindaann Pascal at [facility].   PATIENT IDENTIFICATION:  [name] [name] is a 65 year old man who presents for  routine followup.   PROBLEM LIST:  1. Coronary artery disease.      a.     Cardiac catheterization in 2007 showed 2 vessel disease with       high-grade ostial left main stenosis, now status post bypass surgery       with LIMA to LAD and saphenous vein graft to the diagonal, saphenous       vein graft to the obtuse marginal.  2. Hypertension.  3. Hyperlipidemia.  4. Hypothyroidism.  5. History of prostate cancer status post prostatectomy.   CURRENT MEDICATIONS:  1. Levoxyl 50 mcg once a day.  2. Folic acid.  3. Zocor 40.  4. Metoprolol 50 b.i.d.  5. Aspirin 81 mg a day.   INTERVAL HISTORY:  [name] [name] returns today for routine followup.  He was  doing quite well.  He does have some occasional soreness at his operative  site but no angina, no shortness of breath.  He is interested to know when  he can go back to work.  Blood pressure today was 134/76 with a heart rate  of 56, with a weight of 153.  HEENT:  Sclerae anicteric, EOMi.  There are no xanthelasmas.  Mucous  membranes are moist.  NECK:  Supple, no JVD.  Carotids are 2+ bilaterally without bruit.  There is  no lymphadenopathy or thyromegaly.  CARDIAC:  Irregular rate and rhythm with distant heart sounds, no murmur or  rub.  There is an S4.  LUNGS:  Clear.  ABDOMEN:  Soft, nontender, nondistended.  No hepatosplenomegaly, no bruits,  no masses, good bowel sounds.  EXTREMITIES:  Warm with no cyanosis, clubbing or edema.  There are no rashes  or arthropathies.  NEUROLOGIC:  He is alert and oriented x3.  Cranial nerves II-XII  intact.  He  moves all 4 extremities without difficulty.   ASSESSMENT AND PLAN:  1. Coronary artery disease status post coronary artery bypass grafting.      He is doing quite well.  He is on a good medical regimen.  Continue      current therapy.  2. Hypertension.  Suboptimally controlled.  We will add low dose ACE      inhibitor, lisinopril 10 mg daily.  We will check a BMET on Friday.  3. Hyperlipidemia.  Most recent lipids show a total cholesterol of 119      with LDL 50 and a HDL 36.  LFTs are normal.  Continue Simvastatin.   DISPOSITION:  I have cleared him to go back to work with a 30 pound weight  restriction due to his recent sternotomy.  We will see him back in  4 months  for routine followup.     Bevelyn Buckles. Bensimhon, MD    DRB/MedQ  DD: 10/09/2006  DT: 10/09/2006  Job #: [id number]   cc:   [facility] Dr. Lindaann Pascal, [facility] [place]

## 2011-04-29 NOTE — Assessment & Plan Note (Signed)
[facility]                              CARDIOLOGY OFFICE NOTE   NAME:[name] [name], [name] [name]                        MRN:          [id number]  DATE:07/26/2006                            DOB:          12-Jan-1946    PRIMARY CARE PHYSICIAN:  Dr. Lindaann Pascal in [place].   PATIENT IDENTIFICATION:  [name] [name] is a very pleasant 65 year old male who  presents for his post bypass followup.   PROBLEM LIST:  1. Coronary artery disease.      a.     Cardiac catheterization in July, 2007 for unstable angina showed       two vessel disease with high grade left main stenosis.  He is now       status post bypass surgery with Dr. Laneta Simmers with a LIMA to the LAD,       saphenous vein graft to the diagonal, saphenous vein graft to the       obtuse marginal.  2. Hypertension.  3. Hyperlipidemia.  4. History of prostate cancer, status post prostatectomy.  5. Hypothyroidism.   MEDICATIONS:  1. Levoxyl 50 mcg a day.  2. Folic acid.  3. Zocor 40.  4. Metoprolol 50 b.i.d.  5. Niferex.  6. Aspirin 325 a day.   No known drug allergies.   INTERVAL HISTORY:  [name] [name] returns today for routine followup.  He is  walking on his own and feeling quite well.  He feels he is back to his  previous self.  He does have occasional pain in his upper back and  shoulders, but this is not his anginal pain.  He also feels that he has some  upper airway wheezing but no significant respiratory distress.  He has not  had any heart failure symptoms.  He does complain of some painful swelling  around his vein harvest site in his left leg.  He has not had fevers or  chills.   PHYSICAL EXAMINATION:  VITAL SIGNS:  Blood pressure 116/82, heart rate 79.  Weight is 151.  GENERAL:  He is well-appearing in no acute distress.  Respirations are  unlabored.  HEENT:  Sclerae are anicteric.  EOMI.  There is no xanthelasma.  Mucous  membranes are moist.  NECK:  Supple.  There is no JVD.  Carotids are 2+  bilaterally with no  bruits.  No lymphadenopathy or thyromegaly.  His sternum is stable.  LUNGS:  Clear.  CARDIAC:  He has a regular rate and rhythm with no murmur or rub.  There is  an S4.  ABDOMEN:  Soft, nontender, nondistended.  No hepatosplenomegaly.  No bruits.  No masses.  EXTREMITIES:  Warm with no clubbing, cyanosis or edema.  On his left leg  near the site of his vein graft harvest, he does have a significant  ecchymosis and small area of painful swelling, which is tender to the touch.  There is no surrounding erythema.  NEUROLOGIC:  He is alert and oriented x3.  Cranial nerves II-XII are intact.  He moves all four extremities without difficulty.   EKG shows a  normal sinus rhythm with nonspecific T wave abnormalities, rate  of 79.   ASSESSMENT/PLAN:  1. Coronary artery disease, status post coronary artery bypass graft:  He      is doing quite well.  He is on a good medical regimen.  He will start      cardiac rehab tomorrow.  He will follow up with CVTS shortly.  Given      his job, he will apply for a three month family medical leave act due      to his requirement for heavy lifting, which he cannot perform at this      time.  2. Left lower extremity swelling:  I suspect this might be just a seroma      or small hematoma; however, I do think it is reasonable to get an      ultrasound to rule out a DVT, which will be done tomorrow.  I told him      he can start using hot compresses to help it resolve.  Should it get      worse, he will need to follow up with surgery.   DISPOSITION:  We will see him back for followup in three months.                                Bevelyn Buckles. Bensimhon, MD    DRB/MedQ  DD:  07/26/2006  DT:  07/26/2006  Job #:  [id number]   cc:   Dr. Beryle Quant, MD

## 2011-04-29 NOTE — H&P (Signed)
NAMESANJUAN, SAWA                 ACCOUNT NO.:  [id number]   MEDICAL RECORD NO.:  [id number]          PATIENT TYPE:  INP   LOCATION:  0005                         FACILITY:  [facility]   PHYSICIAN:  Excell Seltzer. Annabell Howells, M.D.    DATE OF BIRTH:  [birth year]   DATE OF ADMISSION:  05/19/2005  DATE OF DISCHARGE:                                HISTORY & PHYSICAL   CHIEF COMPLAINT:  I have prostate cancer.   HISTORY:  Mr. Catoe is a 65 year old white male who was found to have a  prostate nodule on recent exam for microhematuria.  His PSA had reached a  high of 2.3 in April 2003, but in March 2005, it was 1.8.  He was found to  have a Gleason 6 adenocarcinoma of the prostate involving 5% of the prostate  tissue.  A full review of the options available to the patient was made, and  he elected radical prostatectomy.   ALLERGIES:  No known drug allergies.   CURRENT MEDICATIONS:  Levoxyl 50 mcg daily.   PAST MEDICAL HISTORY:  Hypothyroidism.   PAST SURGICAL HISTORY:  Pertinent for prostate biopsy.   SOCIAL HISTORY:  Negative for tobacco or alcohol.  He works for [employer].   FAMILY HISTORY:  Pertinent for diabetes.   REVIEW OF SYSTEMS:  He is entirely without complaints.   PHYSICAL EXAMINATION:  VITAL SIGNS:  Blood pressure 137/88, pulse 68,  temperature 97, respirations 16.  GENERAL:  He is a well-developed, well-nourished, white male in no acute  distress, alert and oriented x3.  HEENT:  Head normocephalic/atraumatic.  NECK:  Supple without thyromegaly or bruit.  LYMPH:  No cervical, axillary, or inguinal adenopathy.  LUNGS:  Clear.  HEART:  Regular rate and rhythm.  ABDOMEN:  Soft, flat, nontender, without masses, hepatosplenomegaly, or CVA  tenderness.  GU EXAM:  Reveals an unremarkable phallus, adequate meatus, scrotum  unremarkable, testicles bilaterally descended, normal without masses or  tenderness.  Epididymides unremarkable.  Anus and perineum without lesions.  RECTAL EXAM:  Normal  sphincter tone.  Prostate is 1+ in size with slight  nodularity in the right lateral aspect about 0.5 cm in size.  Seminal  vesicles are nonpalpable.  No rectal masses are noted.  EXTREMITIES:  Full range of motion without edema.  NEUROLOGICAL:  Grossly intact.  SKIN:  Warm and dry.   IMPRESSION:  1.  Gleason 6, T2a, adenocarcinoma of the prostate.  2.  History of microhematuria.   PLAN:  He will undergo prostatectomy today.       JJW/MEDQ  D:  05/19/2005  T:  05/19/2005  Job:  [id number]   cc:   Lindaann Pascal, M.D.

## 2011-04-29 NOTE — H&P (Signed)
NAMESHANTEL, WESELY NO.:  [id number]   MEDICAL RECORD NO.:  [id number]          PATIENT TYPE:  INP   LOCATION:  1824                         FACILITY:  [facility]   PHYSICIAN:  Arvilla Meres, M.D. LHCDATE OF BIRTH:  [birth year]   DATE OF ADMISSION:  07/06/2006  DATE OF DISCHARGE:                                HISTORY & PHYSICAL   PRIMARY CARE Aveen Stansel:  Lindaann Pascal, P.A.-C., at [facility].   PRIMARY CARDIOLOGIST:  Is new will be Dr. Gala Romney.   CHIEF COMPLAINT:  Chest pain.   HISTORY OF PRESENT ILLNESS:  [name] [name] is a 65 year old white male with no  previous history of coronary artery disease.  He complains of approximately  a 54-month history of chest pain described as a fullness or pressure.  It  is exertional in nature.  It is associated with shortness of breath and  nausea, but he denies diaphoresis.  The other day, he was out doing yard  work with a weed eater, and it reached an 8 or 9/10.  It was relieved by  rest in less than 5 minutes.  He has not had any symptoms that started at  rest.  He states that he was walking on flat ground, and after about 500  feet, his symptoms started, and other than that, it has been with yard work  and other exertional activities.  He feels like the symptoms have been  increasing in frequency over the last couple of weeks, and he also feels  that the harder the exertion the higher intensity of the symptoms.  He is  currently pain free.   PAST MEDICAL HISTORY:  He denies any history of diabetes or hypertension but  states he has a history of hyperlipidemia which he is trying to control with  diet as well as family history of premature coronary artery disease.  He has  a history of hypothyroidism that was diagnosed after he had increasing  fatigue, and he also has a history of prostate cancer for which she had  surgery.   SURGICAL HISTORY:  He is status post prostatectomy in 2006 as well as  tonsillectomy.   ALLERGIES:  No known drug allergies.   CURRENT MEDICATIONS:  1.  Levoxyl 50 mcg daily.  2.  Aspirin 81 mg a day.  3.  Flonase b.i.d. p.r.n.   SOCIAL HISTORY:  Lives in [place] with his wife and works as a Clinical cytogeneticist at [employer].  He denies any history of alcohol, tobacco or drug  abuse.   FAMILY HISTORY:  His mother is alive in her 74s without a history of heart  disease.  His father is alive at age 70 and has had heart disease that  started when he was in his 63s.  He has one brother who had a heart attack  when he was in his 63s.   REVIEW OF SYSTEMS:  He has had some problems with constipation.  He denies  any reflux symptoms.  He denies hematemesis, hemoptysis or melena.  He has  had no recent weight changes.  He  denies depression or anxiety.  He has had  some arthralgias and has had right forearm and wrist pain that is worse when  he first wakes up and in the morning, and he notices that he has trouble  holding a coffee cup first thing in the morning.  He has some symptoms  during the rest of the day but not as bad.  His TSH is followed by Lindaann Pascal.  Review of systems is otherwise negative.   PHYSICAL EXAMINATION:  VITAL SIGNS:  Blood pressure 157/93, heart rate 72,  respiratory rate 20, O2 saturation 99% on room air.  GENERAL:  He is a well-developed, well-nourished white male in no acute  distress.  HEENT:  His head is normocephalic and atraumatic with pupils equal, round,  and reactive to light and accommodation.  Extraocular movements intact.  Sclera clear.  Nares without discharge.  NECK:  There is no lymphadenopathy, thyromegaly, bruit or JVD noted.  CARDIOVASCULAR:  His heart is regular in rate and rhythm with a S1-S2 and a  soft systolic murmur is noted at the left lower sternal border.  His distal  pulses are 2+ in all four extremities, and no femoral bruits are  appreciated.  LUNGS:  Clear to auscultation bilaterally.  SKIN:  No  rashes or lesions are noted.  ABDOMEN:  Soft and nontender with active bowel sounds, and no splenomegaly  is noted by palpation.  EXTREMITIES:  There is no cyanosis, clubbing or edema noted.  MUSCULOSKELETAL:  There is no joint deformity or effusions and no spine or  CVA tenderness.  NEUROLOGICAL:  He is alert and oriented.  Cranial nerves II-XII grossly  intact.   STUDIES:  Chest x-ray and laboratory values are pending at the time of  dictation.   EKG is sinus rhythm, rate 72 beats per minute, with no acute ischemic  changes; no pathologic and Q-waves.   IMPRESSION:  Chest pain.  His symptoms are concerning for unstable anginal  pain.  He has cardiac risk factors that include age, gender, family history  and hyperlipidemia.  It is felt that cardiac catheterization is indicated to  further define his anatomy and guide treatment.  Dr. Nicholes Mango saw the  patient and discussed the risks and benefits of the cath with the patient  and his wife.  He is agreeable to proceeding.  We will add aspirin to his  medication regimen as well as a low dose beta blocker.  We will obtain the  results of the blood work done at [facility], and if  his cath is positive for coronary artery disease, he will be started on  empiric statin.   Again, this is [name] [name], P.A.-C., dictating for Dr. Nicholes Mango who  saw the patient and determined the plan of care.      [name] [name], P.A. [facility]      Arvilla Meres, M.D. [facility]  Electronically Signed    RB/MEDQ  D:  07/06/2006  T:  07/06/2006  Job:  [id number]

## 2011-04-29 NOTE — Op Note (Signed)
NAMESALVATORE, SHEAR                 ACCOUNT NO.:  [id number]   MEDICAL RECORD NO.:  [id number]          PATIENT TYPE:  INP   LOCATION:  2303                         FACILITY:  [facility]   PHYSICIAN:  Evelene Croon, M.D.     DATE OF BIRTH:  1946-10-22   DATE OF PROCEDURE:  07/06/2006  DATE OF DISCHARGE:                                 OPERATIVE REPORT   PREOPERATIVE DIAGNOSIS:  High-grade left main and ostial left circumflex  stenosis with thrombus present and unstable angina.   POSTOPERATIVE DIAGNOSIS:  High-grade left main and ostial left circumflex  stenosis with thrombus present and unstable angina.   OPERATIVE PROCEDURE:  Emergency median sternotomy, extracorporeal  circulation, coronary artery bypass graft surgery x3 using a left internal  mammary artery graft to the left anterior descending coronary artery, with a  saphenous vein graft to the diagonal branch of the LAD and a saphenous vein  graft to the obtuse marginal branch of the left circumflex coronary artery.  Endoscopic vein harvesting from the left leg.   ATTENDING SURGEON:  Evelene Croon, M.D.   ASSISTANT:  Jerold Coombe, P.A.   ANESTHESIA:  General endotracheal.   CLINICAL HISTORY:  This patient is a 65 year old gentleman who presented  with unstable anginal symptoms.  Cardiac catheterization was performed  urgently and showed high-grade 90% distal left main stenosis extending into  the ostium of the left circumflex.  There is thrombus present in the left  main.  The LAD was heavily diseased in its proximal and mid portions with  sequential 90% stenoses.  There was a moderate-sized diagonal branch.  The  left circumflex had two large marginal branches that communicated.  The  right coronary artery was a large vessel.  There was about 40%  nonobstructive disease in the proximal portion.  Left ventricular function  was well-preserved.  After review of the angiogram and examination of the  patient, it was felt that  emergent coronary bypass graft surgery was  indicated to prevent further ischemia and infarction and sudden death.  The  patient had an intra-aortic balloon pump placed in the cath lab by  cardiology for ongoing chest pain, which resolved this chest pain.  I  discussed the operative procedure with the patient including alternatives,  benefits, and risks including bleeding, blood transfusion, infection,  stroke, myocardial infarction, graft failure, and death.  He understood and  agreed to proceed.   OPERATIVE PROCEDURE:  The patient was taken to the operating room and placed  on the table in the supine position.  After induction of general  endotracheal anesthesia, a Foley catheter was placed in the bladder using  sterile technique.  Then the chest, abdomen, and both lower extremities were  prepped and draped in the usual sterile manner.  The chest was entered  through a median sternotomy incision and the pericardium was in the midline.  Examination of the heart showed good ventricular contractility.  The  ascending aorta had no palpable plaques in it.   Then the left internal mammary artery was harvested from the chest wall as  pedicle  graft.  This was a medium caliber vessel with excellent blood  pressure.  At the same time, a segment of greater saphenous vein was  harvested from the left leg using endoscopic vein harvest technique.  This  vein was of medium caliber and fair quality.  The walls were somewhat  thickened.   Then the patient was heparinized and when an adequate __________  was  achieved, the distal ascending aorta was cannulated using a 20-French aortic  cannula for arterial inflow.  Venous outflow was achieved using a two-stage  venous cannula to the right atrial appendage.  An antegrade cardioplegia and  vent cannula was inserted in the aortic root.   The patient was placed on cardiopulmonary bypass and distal coronaries  identified.  The LAD was a large graftable  vessel and it was heavily  diseased in its proximal and mid portions.  The diagonal branch was a  moderate-sized vessel that had no distal disease in it.  The first marginal  branch was a large vessel that was intramyocardial.  It was located at its  mid portion just on the surface of the muscle where it was a large graftable  vessel.  The right coronary artery was visible from its proximal portion  down to the posterior descending branch and no visible plaque was seen or  palpated.   Then the aorta was cross-clamped and 500 cubic centimeters of cold blood  antegrade cardioplegia was administered in the aortic root for quick arrest  of the heart.  Systemic hypothermia to 20 degrees centigrade and topical  hypothermic dressing was used.  Temperature probe was placed in septum and  insulating pad in the pericardium.   The first distal anastomosis was performed to the obtuse marginal branch.  The internal diameter of this vessel was about 2.5 mm.  The conduit used was  a segment of greater saphenous vein, the anastomosis formed in an end-to-  side manner with continuous 7-0 Prolene suture.  Flow was noted through the  graft and was excellent.   Second distal anastomosis was formed to the diagonal branch.  The internal  diameter of this vessel was about 1.6 mm.  The conduit used was a second  segment of greater saphenous vein, the anastomosis formed in an end-to-side  manner using continuous 7-0 Prolene suture.  Flow was noted through the  graft and was excellent.  Then a dose of cardioplegia was given down vein  grafts and the aortic root.   The third distal anastomosis was performed in the distal LAD.  The internal  diameter was about 2 mm.  The conduit used was a left internal mammary graft  and was brought through an opening in left pericardium anterior and phrenic  nerve.  Anastomoses in the LAD in an end-to-side manner using continuous 8-0 Prolene suture.  The pedicle was sutured to  the epicardium with 6-0 Prolene  sutures.  The patient re-warmed to 37 degrees centigrade.  With a crossclamp  in place, the two proximal vein graft anastomoses were performed in the  aortic root end-to-side manner using continuous 6-0 Prolene suture.  Then  the clamp was removed from the mammary pedicle.  There was rapid warming of  the ventricular septum and return of spontaneous ventricular fibrillation.  The crossclamp was removed with a time of 51 minutes, and the patient  spontaneously converted to sinus rhythm.   The proximal and distal anastomoses appeared hemostatic __________  the  graft satisfactory.  Graft markers were  placed around the proximal  anastomoses.  Two temporary right ventricular and right atrial pacing wires  were placed __________  the skin.   The patient had re-warmed to 37 degrees centigrade.  He was weaned from  cardiopulmonary bypass __________ .  Total bypass time was 63 minutes.  Cardiac function appeared excellent with cardiac output of 4 liters a  minute.  Protamine was given, and the venous and aortic catheters were  removed without difficulty.  Hemostasis was achieved.  Three chest tubes  were placed with two in the post pericardium, one in the left pleural space,  and one in the anterior mediastinum.  The pericardium was loosely  reapproximated over the heart.  The sternum was closed with #6 stainless  steel wire.  The fascia was closed with continuous 1-Vicryl suture.  Subcutaneous  tissue was closed with continuous 2-0 Vicryl and the skin with  3-0 Vicryl subcuticular closure.  The lower extremity vein harvest site was  closed in layers in a similar manner.  Sponge, needle, and instrument counts  were correct  __________ .  Dry sterile dressings were applied over the incisions around  the chest tubes which __________ Pleur-Evac suction.  The patient remained  hemodynamically stable and was transferred to the SICU in guarded but stable   condition.      Evelene Croon, M.D.  Electronically Signed     BB/MEDQ  D:  07/06/2006  T:  07/06/2006  Job:  [id number]   cc:   [facility]  Cardiac Catheterization Lab

## 2011-06-24 ENCOUNTER — Other Ambulatory Visit: Payer: Self-pay | Admitting: Internal Medicine

## 2011-07-17 ENCOUNTER — Other Ambulatory Visit: Payer: Self-pay | Admitting: Internal Medicine

## 2011-08-23 ENCOUNTER — Other Ambulatory Visit: Payer: Self-pay | Admitting: Internal Medicine

## 2011-10-27 ENCOUNTER — Other Ambulatory Visit: Payer: Self-pay | Admitting: Internal Medicine

## 2012-01-02 ENCOUNTER — Other Ambulatory Visit: Payer: Self-pay | Admitting: Internal Medicine

## 2012-02-13 ENCOUNTER — Telehealth: Payer: Self-pay | Admitting: Cardiology

## 2012-02-13 NOTE — Telephone Encounter (Signed)
Pt is a former pt of bensimhon, made fu appt with hochrein in [place], not seen 1 1/2 yrs, will he want blood work?

## 2012-02-13 NOTE — Telephone Encounter (Signed)
Left message for pt that I don't know if MD will require lab work from him as we do not know if he has had any recent labs at his PCP.  He may need a fasting lipid and basic metabolic panel due to treatment of hyperlipidemia and ace inhibitor use.  Requested pt call back to further discuss.

## 2012-02-18 ENCOUNTER — Other Ambulatory Visit: Payer: Self-pay | Admitting: Internal Medicine

## 2012-02-26 ENCOUNTER — Other Ambulatory Visit (HOSPITAL_COMMUNITY): Payer: Self-pay | Admitting: Internal Medicine

## 2012-03-12 ENCOUNTER — Other Ambulatory Visit: Payer: Self-pay | Admitting: Cardiology

## 2012-03-21 ENCOUNTER — Ambulatory Visit (INDEPENDENT_AMBULATORY_CARE_PROVIDER_SITE_OTHER): Payer: BC Managed Care – PPO | Admitting: Cardiology

## 2012-03-21 ENCOUNTER — Encounter: Payer: Self-pay | Admitting: Cardiology

## 2012-03-21 VITALS — BP 129/80 | HR 92 | Ht 64.0 in | Wt 153.0 lb

## 2012-03-21 DIAGNOSIS — I10 Essential (primary) hypertension: Secondary | ICD-10-CM

## 2012-03-21 DIAGNOSIS — I2510 Atherosclerotic heart disease of native coronary artery without angina pectoris: Secondary | ICD-10-CM

## 2012-03-21 DIAGNOSIS — E782 Mixed hyperlipidemia: Secondary | ICD-10-CM

## 2012-03-21 DIAGNOSIS — E785 Hyperlipidemia, unspecified: Secondary | ICD-10-CM | POA: Insufficient documentation

## 2012-03-21 MED ORDER — ASPIRIN 81 MG PO TABS: 325.0000 mg | ORAL_TABLET | Freq: Every day | ORAL | Status: DC

## 2012-03-21 NOTE — Progress Notes (Signed)
   HPI The patient presents for followup of known coronary disease. He was previously seen Dr. Gala [name].  He has a history of CABG but has been doing well. He denies any of the symptoms that was his previous angina. The patient denies any new symptoms such as chest discomfort, neck or arm discomfort. There has been no new shortness of breath, PND or orthopnea. There have been no reported palpitations, presyncope or syncope. He works an active and vigorous job without symptoms.  No Known Allergies  Current Outpatient Prescriptions  Medication Sig Dispense Refill  . aspirin 325 MG tablet Take 325 mg by mouth daily.      . carvedilol (COREG) 3.125 MG tablet TAKE 1 TABLET TWICE A DAY  25 tablet  0  . levothyroxine (SYNTHROID, LEVOTHROID) 50 MCG tablet Take 50 mcg by mouth daily.      Marland Kitchen lisinopril (PRINIVIL,ZESTRIL) 10 MG tablet TAKE 1 TABLET DAILY (NEED TO CALL TO MAKE FOLLOW UP APPOINTMENT)  90 tablet  1  . NIASPAN 1000 MG CR tablet TAKE 1 TABLET AT BEDTIME (OVERDUE FOR FOLLOW UP APPOINTMENT WITH DR BENSIMHON)  90 tablet  0  . simvastatin (ZOCOR) 20 MG tablet TAKE 1 TABLET DAILY AT BEDTIME  90 tablet  2    Past Medical History  Diagnosis Date  . CAD (coronary artery disease)   . HTN (hypertension)   . Hyperlipidemia   . Hypothyroidism   . Prostate cancer   . Degenerative disc disease     Past Surgical History  Procedure Date  . Coronary artery bypass graft     LIMA to the LAD, SVG to diagonal, SVG to obtuse marginal.2007 Dr. Laneta Simmers   . Prostatectomy     ROS:  As stated in the HPI and negative for all other systems.  PHYSICAL EXAM BP 129/80  Pulse 92  Ht 5\' 4"  (1.626 m)  Wt 153 lb (69.4 kg)  BMI 26.26 kg/m2 GENERAL:  Well appearing HEENT:  Pupils equal round and reactive, fundi not visualized, oral mucosa unremarkable NECK:  No jugular venous distention, waveform within normal limits, carotid upstroke brisk and symmetric, no bruits, no thyromegaly LYMPHATICS:  No cervical,  inguinal adenopathy LUNGS:  Clear to auscultation bilaterally BACK:  No CVA tenderness CHEST:  Well healed sternotomy scar. HEART:  PMI not displaced or sustained,S1 and S2 within normal limits, no S3, no S4, no clicks, no rubs, no murmurs ABD:  Flat, positive bowel sounds normal in frequency in pitch, no bruits, no rebound, no guarding, no midline pulsatile mass, no hepatomegaly, no splenomegaly EXT:  2 plus pulses throughout, no edema, no cyanosis no clubbing SKIN:  No rashes no nodules NEURO:  Cranial nerves II through XII grossly intact, motor grossly intact throughout [facility]:  Cognitively intact, oriented to person place and time  EKG:  Sinus rhythm, rate 92, axis within normal limits, intervals within normal limits, no acute ST-T wave changes, baseline artifact 03/21/2012  ASSESSMENT AND PLAN

## 2012-03-21 NOTE — Assessment & Plan Note (Signed)
The patient could not afford his Niaspan. He stopped taking it about one week prior to the most recent lipid profile. I reviewed this and risk profile demonstrated an HDL of 58 and an LDL of 60. I would like him to continue on at least 500 mg extended release over-the-counter. We will get lipid and liver studies in 8 weeks.

## 2012-03-21 NOTE — Assessment & Plan Note (Signed)
The blood pressure is at target. No change in medications is indicated. We will continue with therapeutic lifestyle changes (TLC).  

## 2012-03-21 NOTE — Assessment & Plan Note (Addendum)
The patient has no new sypmtoms.  No further cardiovascular testing is indicated.  We will continue with aggressive risk reduction and meds as listed. He can reduce his ASA to 81 mg.

## 2012-03-21 NOTE — Patient Instructions (Addendum)
Please stop your Niaspan and take Niacin ER 500 mg over the counter Decrease your Asa to 81 mg a day Continue your other medications as listed.  Please have fasting blood work in 6 weeks. (lipid and liver profile, hand-written order given)  Please follow up with Dr Antoine Poche in 6 months.  You will receive a letter in the mail 2 months before you are due.  Please call us when you receive this letter to schedule your follow up appointment.

## 2012-05-29 ENCOUNTER — Encounter: Payer: Self-pay | Admitting: Cardiology

## 2012-08-22 ENCOUNTER — Ambulatory Visit (INDEPENDENT_AMBULATORY_CARE_PROVIDER_SITE_OTHER): Payer: BC Managed Care – PPO | Admitting: General Surgery

## 2012-09-07 ENCOUNTER — Encounter: Payer: Self-pay | Admitting: Cardiology

## 2013-09-24 ENCOUNTER — Ambulatory Visit (INDEPENDENT_AMBULATORY_CARE_PROVIDER_SITE_OTHER): Payer: BC Managed Care – PPO | Admitting: Family Medicine

## 2013-09-24 VITALS — BP 138/84 | HR 68 | Temp 97.8°F | Ht 64.0 in | Wt 152.0 lb

## 2013-09-24 DIAGNOSIS — R079 Chest pain, unspecified: Secondary | ICD-10-CM

## 2013-09-24 NOTE — Progress Notes (Signed)
  Subjective:    Patient ID: [name] [name], male    DOB: 1946/09/16, 67 y.o.   MRN: [id number]  HPI  This 67 y.o. male presents for evaluation of chest pain.  Patient has hx of CAD and he has  Called his cardiologist and has an appointment in November.  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  He has been having substernal chest pain.  Review of Systems C/o chest pain No  SOB, HA, dizziness, vision change, N/V, diarrhea, constipation, dysuria, urinary urgency or frequency, myalgias, arthralgias or rash.     Objective:   Physical Exam  Vital signs noted  Well developed well nourished male.  HEENT - Head atraumatic Normocephalic                Eyes - PERRLA, Conjuctiva - clear Sclera- Clear EOMI                Ears - EAC's Wnl TM's Wnl Gross Hearing WNL                Nose - Nares patent                 Throat - oropharanx wnl Respiratory - Lungs CTA bilateral Cardiac - RRR S1 and S2 without murmur. MS - TTP left sternal border and left intercostals.     EKG - NSR without acute ST-T changes and no changes with last EKG in chart. Assessment & Plan:  Chest pain - Plan: EKG 12-Lead  Costochondritis - Naprosyn 500mg  one po bid x 10 days #20.  [name] Pancoast Nyimah Shadduck FNP  [name] Canter FNP

## 2013-09-24 NOTE — Patient Instructions (Signed)
Chest Pain (Nonspecific) °It is often hard to give a specific diagnosis for the cause of chest pain. There is always a chance that your pain could be related to something serious, such as a heart attack or a blood clot in the lungs. You need to follow up with your caregiver for further evaluation. °CAUSES  °· Heartburn. °· Pneumonia or bronchitis. °· Anxiety or stress. °· Inflammation around your heart (pericarditis) or lung (pleuritis or pleurisy). °· A blood clot in the lung. °· A collapsed lung (pneumothorax). It can develop suddenly on its own (spontaneous pneumothorax) or from injury (trauma) to the chest. °· Shingles infection (herpes zoster virus). °The chest wall is composed of bones, muscles, and cartilage. Any of these can be the source of the pain. °· The bones can be bruised by injury. °· The muscles or cartilage can be strained by coughing or overwork. °· The cartilage can be affected by inflammation and become sore (costochondritis). °DIAGNOSIS  °Lab tests or other studies, such as X-rays, electrocardiography, stress testing, or cardiac imaging, may be needed to find the cause of your pain.  °TREATMENT  °· Treatment depends on what may be causing your chest pain. Treatment may include: °· Acid blockers for heartburn. °· Anti-inflammatory medicine. °· Pain medicine for inflammatory conditions. °· Antibiotics if an infection is present. °· You may be advised to change lifestyle habits. This includes stopping smoking and avoiding alcohol, caffeine, and chocolate. °· You may be advised to keep your head raised (elevated) when sleeping. This reduces the chance of acid going backward from your stomach into your esophagus. °· Most of the time, nonspecific chest pain will improve within 2 to 3 days with rest and mild pain medicine. °HOME CARE INSTRUCTIONS  °· If antibiotics were prescribed, take your antibiotics as directed. Finish them even if you start to feel better. °· For the next few days, avoid physical  activities that bring on chest pain. Continue physical activities as directed. °· Do not smoke. °· Avoid drinking alcohol. °· Only take over-the-counter or prescription medicine for pain, discomfort, or fever as directed by your caregiver. °· Follow your caregiver's suggestions for further testing if your chest pain does not go away. °· Keep any follow-up appointments you made. If you do not go to an appointment, you could develop lasting (chronic) problems with pain. If there is any problem keeping an appointment, you must call to reschedule. °SEEK MEDICAL CARE IF:  °· You think you are having problems from the medicine you are taking. Read your medicine instructions carefully. °· Your chest pain does not go away, even after treatment. °· You develop a rash with blisters on your chest. °SEEK IMMEDIATE MEDICAL CARE IF:  °· You have increased chest pain or pain that spreads to your arm, neck, jaw, back, or abdomen. °· You develop shortness of breath, an increasing cough, or you are coughing up blood. °· You have severe back or abdominal pain, feel nauseous, or vomit. °· You develop severe weakness, fainting, or chills. °· You have a fever. °THIS IS AN EMERGENCY. Do not wait to see if the pain will go away. Get medical help at once. Call your local emergency services (911 in [place] Do not drive yourself to the hospital. °MAKE SURE YOU:  °· Understand these instructions. °· Will watch your condition. °· Will get help right away if you are not doing well or get worse. °Document Released: 09/07/2005 Document Revised: 02/20/2012 Document Reviewed: 07/03/2008 °[employer]® Patient Information ©2014 [employer]   [employer]. ° °

## 2013-09-25 ENCOUNTER — Telehealth: Payer: Self-pay | Admitting: Family Medicine

## 2013-09-30 ENCOUNTER — Other Ambulatory Visit: Payer: Self-pay | Admitting: Family Medicine

## 2013-09-30 MED ORDER — NAPROXEN 500 MG PO TABS: 500.0000 mg | ORAL_TABLET | Freq: Two times a day (BID) | ORAL | Status: DC

## 2013-10-18 ENCOUNTER — Other Ambulatory Visit: Payer: Self-pay | Admitting: Nurse Practitioner

## 2013-10-22 ENCOUNTER — Ambulatory Visit (INDEPENDENT_AMBULATORY_CARE_PROVIDER_SITE_OTHER): Payer: BC Managed Care – PPO | Admitting: Cardiology

## 2013-10-22 ENCOUNTER — Encounter: Payer: Self-pay | Admitting: Cardiology

## 2013-10-22 VITALS — BP 134/83 | HR 60 | Ht 65.0 in | Wt 149.0 lb

## 2013-10-22 DIAGNOSIS — I10 Essential (primary) hypertension: Secondary | ICD-10-CM

## 2013-10-22 DIAGNOSIS — I2510 Atherosclerotic heart disease of native coronary artery without angina pectoris: Secondary | ICD-10-CM

## 2013-10-22 LAB — LIPID PANEL
Cholesterol: 110 mg/dL (ref 0–200)
HDL: 40.2 mg/dL (ref >39.00)
Total CHOL/HDL Ratio: 3
Triglycerides: 81 mg/dL (ref 0.0–149.0)
VLDL: 16.2 mg/dL (ref 0.0–40.0)

## 2013-10-22 LAB — HEPATIC FUNCTION PANEL
ALT: 27 U/L (ref 0–53)
AST: 31 U/L (ref 0–37)
Albumin: 4.2 g/dL (ref 3.5–5.2)
Alkaline Phosphatase: 56 U/L (ref 39–117)
Total Protein: 7.3 g/dL (ref 6.0–8.3)

## 2013-10-22 NOTE — Progress Notes (Signed)
   HPI The patient presents for followup of known coronary disease.  He has a history of CABG but has been doing well. However, he was lifting heavy objects and has had some discomfort in his chest. This is probably different than his previous angina. There is some soreness to touch. Seems to be better over the last few weeks. I did review an EKG from last month and it was without acute changes.He denies any neck or arm discomfort. There has been no new shortness of breath, PND or orthopnea. There have been no reported palpitations, presyncope or syncope.   No Known Allergies  Current Outpatient Prescriptions  Medication Sig Dispense Refill  . aspirin 325 MG tablet Take 325 mg by mouth daily.      . carvedilol (COREG) 3.125 MG tablet TAKE 1 TABLET TWICE A DAY  180 tablet  0  . levothyroxine (SYNTHROID, LEVOTHROID) 50 MCG tablet TAKE 1 TABLET EVERY DAY  90 tablet  0  . lisinopril (PRINIVIL,ZESTRIL) 10 MG tablet TAKE 1 TABLET EVERY DAY  90 tablet  0  . naproxen (NAPROSYN) 500 MG tablet Take 500 mg by mouth daily.      . simvastatin (ZOCOR) 20 MG tablet TAKE 1 TABLET AT BEDTIME  90 tablet  0   No current facility-administered medications for this visit.    Past Medical History  Diagnosis Date  . CAD (coronary artery disease)   . HTN (hypertension)   . Hyperlipidemia   . Hypothyroidism   . Prostate cancer   . Degenerative disc disease     Past Surgical History  Procedure Laterality Date  . Coronary artery bypass graft      LIMA to the LAD, SVG to diagonal, SVG to obtuse marginal.2007 Dr. Laneta Simmers   . Prostatectomy      ROS:  As stated in the HPI and negative for all other systems.  PHYSICAL EXAM BP 134/83  Pulse 60  Ht 5\' 5"  (1.651 m)  Wt 149 lb (67.586 kg)  BMI 24.79 kg/m2 GENERAL:  Well appearing HEENT:  Pupils equal round and reactive, fundi not visualized, oral mucosa unremarkable NECK:  No jugular venous distention, waveform within normal limits, carotid upstroke brisk and  symmetric, no bruits, no thyromegaly LYMPHATICS:  No cervical, inguinal adenopathy LUNGS:  Clear to auscultation bilaterally BACK:  No CVA tenderness CHEST:  Well healed sternotomy scar. HEART:  PMI not displaced or sustained,S1 and S2 within normal limits, no S3, no S4, no clicks, no rubs, no murmurs ABD:  Flat, positive bowel sounds normal in frequency in pitch, no bruits, no rebound, no guarding, no midline pulsatile mass, no hepatomegaly, no splenomegaly EXT:  2 plus pulses throughout, no edema, no cyanosis no clubbing SKIN:  No rashes no nodules   ASSESSMENT AND PLAN  CAD:  I suspect that his chest pain is musculoskeletal.  However, I will bring the patient back for a POET (Plain Old Exercise Test). This will allow me to screen for obstructive coronary disease, risk stratify and very importantly provide a prescription for exercise.  HTN:  The blood pressure is at target. No change in medications is indicated. We will continue with therapeutic lifestyle changes (TLC).  HYPERLIPIDEMIA:  I will check a lipid profile and liver enzymes today.

## 2013-10-22 NOTE — Patient Instructions (Addendum)
Your physician has requested that you have an exercise tolerance test. Please also follow instruction sheet, as given.  Your physician wants you to follow-up in: 1 YEAR WITH DR. HOCHREIN You will receive a reminder letter in the mail two months in advance. If you don't receive a letter, please call our office to schedule the follow-up appointment.  Your physician recommends that you return for lab work in: TODAY (LIPID, LIVER)  Your physician recommends that you continue on your current medications as directed. Please refer to the Current Medication list given to you today.

## 2013-10-23 ENCOUNTER — Ambulatory Visit: Payer: BC Managed Care – PPO | Admitting: Cardiology

## 2013-11-18 ENCOUNTER — Ambulatory Visit (INDEPENDENT_AMBULATORY_CARE_PROVIDER_SITE_OTHER): Payer: BC Managed Care – PPO | Admitting: Cardiology

## 2013-11-18 DIAGNOSIS — I10 Essential (primary) hypertension: Secondary | ICD-10-CM

## 2013-11-18 DIAGNOSIS — I2510 Atherosclerotic heart disease of native coronary artery without angina pectoris: Secondary | ICD-10-CM

## 2013-11-18 NOTE — Progress Notes (Signed)
Exercise Treadmill Test  Pre-Exercise Testing Evaluation Rhythm: sinus bradycardia  Rate: 57     Test  Exercise Tolerance Test Ordering MD: Angelina Sheriff, MD  Interpreting MD: Carolanne Grumbling, MD  Unique Test No: 1  Treadmill:  1  Indication for ETT: known ASHD  Contraindication to ETT: No   Stress Modality: exercise - treadmill  Cardiac Imaging Performed: non   Protocol: standard Bruce - maximal  Max BP:  203/74mmHg  Max MPHR (bpm):  156 85% MPR (bpm):  133  MPHR obtained (bpm):  146 % MPHR obtained:  94%  Reached 85% MPHR (min:sec):  7:10 Total Exercise Time (min-sec):  8 minutes  Workload in METS:  10 Borg Scale: 15  Reason ETT Terminated:  Maximum HR acheived    ST Segment Analysis At Rest: non-specific ST segment slurring With Exercise: less than 1mm of upsloping ST segement depression at peak exercise  Other Information Arrhythmia:  No Angina during ETT:  absent (0) Quality of ETT:  diagnostic  ETT Interpretation:  normal - no evidence of ischemia by ST analysis  Comments: No chest pain or SOB and no diagnostic EKG changes of ischemia at an excellent workload. Hypertensive BP response to exercise.  Recommendations: Further workup per Dr. Antoine Poche

## 2014-01-13 ENCOUNTER — Encounter: Payer: Self-pay | Admitting: *Deleted

## 2014-01-13 ENCOUNTER — Ambulatory Visit (INDEPENDENT_AMBULATORY_CARE_PROVIDER_SITE_OTHER): Payer: BC Managed Care – PPO | Admitting: Family Medicine

## 2014-01-13 ENCOUNTER — Encounter: Payer: Self-pay | Admitting: Family Medicine

## 2014-01-13 VITALS — BP 177/95 | HR 85 | Temp 99.7°F | Ht 65.0 in | Wt 153.0 lb

## 2014-01-13 DIAGNOSIS — R32 Unspecified urinary incontinence: Secondary | ICD-10-CM

## 2014-01-13 DIAGNOSIS — Z8546 Personal history of malignant neoplasm of prostate: Secondary | ICD-10-CM

## 2014-01-13 DIAGNOSIS — I10 Essential (primary) hypertension: Secondary | ICD-10-CM

## 2014-01-13 DIAGNOSIS — R05 Cough: Secondary | ICD-10-CM

## 2014-01-13 DIAGNOSIS — R300 Dysuria: Secondary | ICD-10-CM

## 2014-01-13 DIAGNOSIS — R319 Hematuria, unspecified: Secondary | ICD-10-CM

## 2014-01-13 DIAGNOSIS — B349 Viral infection, unspecified: Secondary | ICD-10-CM

## 2014-01-13 DIAGNOSIS — R059 Cough, unspecified: Secondary | ICD-10-CM

## 2014-01-13 DIAGNOSIS — R509 Fever, unspecified: Secondary | ICD-10-CM

## 2014-01-13 LAB — POCT INFLUENZA A/B
INFLUENZA B, POC: NEGATIVE
Influenza A, POC: NEGATIVE

## 2014-01-13 LAB — POCT CBC
GRANULOCYTE PERCENT: 71.2 % (ref 37–80)
HCT, POC: 44.7 % (ref 43.5–53.7)
Hemoglobin: 14.4 g/dL (ref 14.1–18.1)
Lymph, poc: 2.2 (ref 0.6–3.4)
MCH, POC: 29.5 pg (ref 27–31.2)
MCHC: 32.3 g/dL (ref 31.8–35.4)
MCV: 91.3 fL (ref 80–97)
MPV: 8 fL (ref 0–99.8)
POC GRANULOCYTE: 7 — AB (ref 2–6.9)
POC LYMPH PERCENT: 22.2 %L (ref 10–50)
Platelet Count, POC: 158 10*3/uL (ref 142–424)
RBC: 4.9 M/uL (ref 4.69–6.13)
RDW, POC: 12.6 %
WBC: 9.9 10*3/uL (ref 4.6–10.2)

## 2014-01-13 LAB — POCT URINALYSIS DIPSTICK
Bilirubin, UA: NEGATIVE
GLUCOSE UA: NEGATIVE
KETONES UA: NEGATIVE
Leukocytes, UA: NEGATIVE
Nitrite, UA: NEGATIVE
Protein, UA: NEGATIVE
SPEC GRAV UA: 1.01
Urobilinogen, UA: NEGATIVE
pH, UA: 7.5

## 2014-01-13 LAB — POCT UA - MICROSCOPIC ONLY
Casts, Ur, LPF, POC: NEGATIVE
Crystals, Ur, HPF, POC: NEGATIVE
Mucus, UA: NEGATIVE
Yeast, UA: NEGATIVE

## 2014-01-13 LAB — POCT RAPID STREP A (OFFICE): RAPID STREP A SCREEN: NEGATIVE

## 2014-01-13 MED ORDER — OSELTAMIVIR PHOSPHATE 75 MG PO CAPS: 75.0000 mg | ORAL_CAPSULE | Freq: Two times a day (BID) | ORAL | Status: DC

## 2014-01-13 NOTE — Patient Instructions (Addendum)
Continue current medications. Continue good therapeutic lifestyle changes which include good diet and exercise. Fall precautions discussed with patient. Schedule your flu vaccine if you haven't had it yet If you are over 66 years old - you may need Prevnar 56 or the adult Pneumonia vaccine. Drink plenty of fluids Take Tylenol for aches pains and fever Take Mucinex maximum strength over-the-counter one twice daily for cough and congestion, blue and white in color

## 2014-01-13 NOTE — Addendum Note (Signed)
Addended by: Wyline Mood on: 01/13/2014 05:44 PM   Modules accepted: Orders

## 2014-01-13 NOTE — Addendum Note (Signed)
Addended by: Zannie Cove on: 01/13/2014 05:28 PM   Modules accepted: Orders

## 2014-01-13 NOTE — Progress Notes (Signed)
Subjective:    Patient ID: [name] [name], male    DOB: 1946-03-28, 68 y.o.   MRN: [id number]  HPI Patient here today for sinus, cough, congestion, and dysuria.    Patient Active Problem List   Diagnosis Date Noted  . CAD (coronary artery disease)   . HTN (hypertension)   . 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 aspirin 325 MG tablet Take 325 mg by mouth daily.  . carvedilol (COREG) 3.125 MG tablet TAKE 1 TABLET TWICE A DAY  . levothyroxine (SYNTHROID, LEVOTHROID) 50 MCG tablet TAKE 1 TABLET EVERY DAY  . lisinopril (PRINIVIL,ZESTRIL) 10 MG tablet TAKE 1 TABLET EVERY DAY  . simvastatin (ZOCOR) 20 MG tablet TAKE 1 TABLET AT BEDTIME  . naproxen (NAPROSYN) 500 MG tablet Take 500 mg by mouth daily.   \ Review of Systems  Constitutional: Negative.   HENT: Positive for congestion, sinus pressure and sore throat.   Eyes: Negative.   Respiratory: Positive for cough.   Cardiovascular: Negative.   Gastrointestinal: Negative.   Endocrine: Negative.   Genitourinary: Positive for dysuria.  Musculoskeletal: Negative.   Skin: Negative.   Allergic/Immunologic: Negative.   Neurological: Negative.   Hematological: Negative.   Psychiatric/Behavioral: Negative.        Objective:   Physical Exam  Nursing note and vitals reviewed. Constitutional: He is oriented to person, place, and time. He appears well-developed and well-nourished. No distress.  HENT:  Head: Normocephalic and atraumatic.  Right Ear: External ear normal.  Left Ear: External ear normal.  Mouth/Throat: Oropharynx is clear and moist. No oropharyngeal exudate.  Nasal congestion and slightly red throat posterior  Eyes: Conjunctivae and EOM are normal. Pupils are equal, round, and reactive to light. Right eye exhibits no discharge. Left eye exhibits no discharge. No scleral icterus.  Neck: Normal range of motion. Neck supple. No tracheal deviation present.  No thyromegaly present.  Cardiovascular: Normal rate, regular rhythm, normal heart sounds and intact distal pulses.  Exam reveals no gallop and no friction rub.   No murmur heard. At 84 per minute  Pulmonary/Chest: Effort normal and breath sounds normal. No respiratory distress. He has no wheezes. He has no rales. He exhibits no tenderness.  Dry cough  Abdominal: Soft. Bowel sounds are normal. He exhibits no mass. There is no tenderness. There is no rebound and no guarding.  Musculoskeletal: Normal range of motion. He exhibits no edema and no tenderness.  Lymphadenopathy:    He has no cervical adenopathy.  Neurological: He is alert and oriented to person, place, and time. No cranial nerve deficit.  Skin: Skin is warm and dry. No rash noted.  Psychiatric: He has a normal mood and affect. His behavior is normal. Judgment and thought content normal.   BP 177/95  Pulse 85  Temp(Src) 99.7 F (37.6 C) (Oral)  Ht 5\' 5"  (1.651 m)  Wt 153 lb (69.4 kg)  BMI 25.46 kg/m2 Results for orders placed in visit on 01/13/14  POCT INFLUENZA A/B      Result Value Range   Influenza A, POC Negative     Influenza B, POC Negative    POCT RAPID STREP A (OFFICE)      Result Value Range   Rapid Strep A Screen Negative  Negative  POCT UA - MICROSCOPIC ONLY      Result Value Range   WBC, Ur, HPF, POC occ     RBC, urine, microscopic 5-10  Bacteria, U Microscopic occ     Mucus, UA neg     Epithelial cells, urine per micros occ     Crystals, Ur, HPF, POC neg     Casts, Ur, LPF, POC neg     Yeast, UA neg    POCT URINALYSIS DIPSTICK      Result Value Range   Color, UA yellow     Clarity, UA clear     Glucose, UA neg     Bilirubin, UA neg     Ketones, UA neg     Spec Grav, UA 1.010     Blood, UA large     pH, UA 7.5     Protein, UA neg     Urobilinogen, UA negative     Nitrite, UA neg     Leukocytes, UA Negative            Assessment & Plan:   1. Dysuria - POCT urinalysis dipstick  2.  Cough - POCT Influenza A/B - POCT rapid strep A - POCT UA - Microscopic Only  3. Fever, unspecified - POCT Influenza A/B - POCT rapid strep A - POCT UA - Microscopic Only - POCT urinalysis dipstick  4. Hematuria  5. Viral syndrome - oseltamivir (TAMIFLU) 75 MG capsule; Take 1 capsule (75 mg total) by mouth 2 (two) times daily.  Dispense: 10 capsule; Refill: 1  6. Hypertension  7. History of prostate cancer -Referral to urologist for prostate cancer followup and hematuria  8. Incontinence of urine -Referral to urologist  Patient Instructions  Continue current medications. Continue good therapeutic lifestyle changes which include good diet and exercise. Fall precautions discussed with patient. Schedule your flu vaccine if you haven't had it yet If you are over 5 years old - you may need Prevnar 20 or the adult Pneumonia vaccine. Drink plenty of fluids Take Tylenol for aches pains and fever Take Mucinex maximum strength over-the-counter one twice daily for cough and congestion, blue and white in color    Remain out of work for the next one to 2 days and take medicine as directed.dwmsig Arrie Senate MD

## 2014-01-15 ENCOUNTER — Other Ambulatory Visit: Payer: Self-pay | Admitting: Family Medicine

## 2014-01-17 ENCOUNTER — Ambulatory Visit (INDEPENDENT_AMBULATORY_CARE_PROVIDER_SITE_OTHER): Payer: BC Managed Care – PPO | Admitting: General Practice

## 2014-01-17 ENCOUNTER — Other Ambulatory Visit: Payer: Self-pay | Admitting: *Deleted

## 2014-01-17 VITALS — BP 145/87 | HR 71 | Temp 97.1°F | Ht 65.0 in | Wt 153.0 lb

## 2014-01-17 DIAGNOSIS — J309 Allergic rhinitis, unspecified: Secondary | ICD-10-CM

## 2014-01-17 DIAGNOSIS — J029 Acute pharyngitis, unspecified: Secondary | ICD-10-CM

## 2014-01-17 DIAGNOSIS — J00 Acute nasopharyngitis [common cold]: Secondary | ICD-10-CM

## 2014-01-17 LAB — POCT RAPID STREP A (OFFICE): Rapid Strep A Screen: NEGATIVE

## 2014-01-17 MED ORDER — NAPROXEN 500 MG PO TABS: 500.0000 mg | ORAL_TABLET | Freq: Every day | ORAL | Status: DC

## 2014-01-17 MED ORDER — SIMVASTATIN 20 MG PO TABS: ORAL_TABLET | ORAL | Status: DC

## 2014-01-17 MED ORDER — CARVEDILOL 3.125 MG PO TABS: ORAL_TABLET | ORAL | Status: DC

## 2014-01-17 MED ORDER — LISINOPRIL 10 MG PO TABS: ORAL_TABLET | ORAL | Status: DC

## 2014-01-17 MED ORDER — LEVOTHYROXINE SODIUM 50 MCG PO TABS: ORAL_TABLET | ORAL | Status: DC

## 2014-01-17 MED ORDER — FLUTICASONE PROPIONATE 50 MCG/ACT NA SUSP: 2.0000 | Freq: Every day | NASAL | Status: DC

## 2014-01-17 NOTE — Progress Notes (Signed)
   Subjective:    Patient ID: [name] [name], male    DOB: 07-02-1946, 68 y.o.   MRN: [id number]  Sore Throat  This is a new problem. The current episode started in the past 7 days. The problem has been gradually worsening. Neither side of throat is experiencing more pain than the other. There has been no fever. The pain is at a severity of 5/10. Associated symptoms include congestion and coughing. Pertinent negatives include no diarrhea, ear pain, headaches, shortness of breath or swollen glands. Treatments tried: oral decongestants. The treatment provided no relief.      Review of Systems  Constitutional: Positive for chills.  HENT: Positive for congestion, sinus pressure and sore throat. Negative for ear pain.   Respiratory: Positive for cough. Negative for chest tightness and shortness of breath.   Cardiovascular: Negative for chest pain and palpitations.  Gastrointestinal: Negative for diarrhea.  Neurological: Negative for dizziness, weakness and headaches.       Objective:   Physical Exam  Constitutional: He is oriented to person, place, and time. He appears well-developed and well-nourished.  HENT:  Head: Normocephalic and atraumatic.  Right Ear: External ear normal.  Left Ear: External ear normal.  Neck: Normal range of motion. Neck supple. No thyromegaly present.  Cardiovascular: Normal rate, regular rhythm and normal heart sounds.   Pulmonary/Chest: Effort normal and breath sounds normal. No respiratory distress. He exhibits no tenderness.  Lymphadenopathy:    He has no cervical adenopathy.  Neurological: He is alert and oriented to person, place, and time.  Skin: Skin is warm and dry.  Psychiatric: He has a normal mood and affect.     Results for orders placed in visit on 01/17/14  POCT RAPID STREP A (OFFICE)      Result Value Range   Rapid Strep A Screen Negative  Negative        Assessment & Plan:  1. Sore throat  - POCT rapid strep A  2. Common  cold   3. Allergic rhinitis  - fluticasone (FLONASE) 50 MCG/ACT nasal spray; Place 2 sprays into both nostrils daily.  Dispense: 16 g; Refill: 6 -avoid irritants -RTO if symptoms worsen or unresolved Patient verbalized understanding Erby Pian, FNP-C

## 2014-01-17 NOTE — Patient Instructions (Addendum)

## 2014-02-21 ENCOUNTER — Other Ambulatory Visit: Payer: Self-pay | Admitting: Urology

## 2014-02-21 ENCOUNTER — Ambulatory Visit (INDEPENDENT_AMBULATORY_CARE_PROVIDER_SITE_OTHER): Payer: BC Managed Care – PPO | Admitting: Urology

## 2014-02-21 DIAGNOSIS — R3129 Other microscopic hematuria: Secondary | ICD-10-CM

## 2014-02-21 DIAGNOSIS — Z8546 Personal history of malignant neoplasm of prostate: Secondary | ICD-10-CM

## 2014-02-21 DIAGNOSIS — N393 Stress incontinence (female) (male): Secondary | ICD-10-CM

## 2014-02-27 ENCOUNTER — Ambulatory Visit (HOSPITAL_COMMUNITY): Payer: BC Managed Care – PPO

## 2014-04-17 ENCOUNTER — Other Ambulatory Visit: Payer: Self-pay | Admitting: Family Medicine

## 2014-05-01 ENCOUNTER — Ambulatory Visit (INDEPENDENT_AMBULATORY_CARE_PROVIDER_SITE_OTHER): Payer: BC Managed Care – PPO

## 2014-05-01 ENCOUNTER — Ambulatory Visit (INDEPENDENT_AMBULATORY_CARE_PROVIDER_SITE_OTHER): Payer: BC Managed Care – PPO | Admitting: Family Medicine

## 2014-05-01 VITALS — BP 141/83 | HR 65 | Temp 97.0°F | Wt 154.4 lb

## 2014-05-01 DIAGNOSIS — W19XXXA Unspecified fall, initial encounter: Secondary | ICD-10-CM

## 2014-05-01 DIAGNOSIS — S40021A Contusion of right upper arm, initial encounter: Secondary | ICD-10-CM

## 2014-05-01 DIAGNOSIS — S40029A Contusion of unspecified upper arm, initial encounter: Secondary | ICD-10-CM

## 2014-05-01 MED ORDER — NAPROXEN 500 MG PO TABS: 500.0000 mg | ORAL_TABLET | Freq: Every day | ORAL | Status: DC

## 2014-05-01 NOTE — Progress Notes (Signed)
   Subjective:    Patient ID: [name] [name], male    DOB: [birth year], 68 y.o.   MRN: [id number]  HPI This 68 y.o. male presents for evaluation of right arm discomfort and swelling after falling yesterday.   Review of Systems C/o right arm pain No chest pain, SOB, HA, dizziness, vision change, N/V, diarrhea, constipation, dysuria, urinary urgency or frequency, myalgias, arthralgias or rash.     Objective:   Physical Exam  Vital signs noted  Well developed well nourished male.  HEENT - Head atraumatic Normocephalic                Eyes - PERRLA, Conjuctiva - clear Sclera- Clear EOMI                Ears - EAC's Wnl TM's Wnl Gross Hearing WNL                Throat - oropharanx wnl Respiratory - Lungs CTA bilateral Cardiac - RRR S1 and S2 without murmur MS - right forearm with contusion  Xray right arm - No fracture Prelimnary reading by Iverson Alamin    Assessment & Plan:  Fall - Plan: DG Forearm Right, naproxen (NAPROSYN) 500 MG tablet  Contusion of right arm - Plan: naproxen (NAPROSYN) 500 MG tablet  Lysbeth Penner FNP

## 2014-07-27 ENCOUNTER — Other Ambulatory Visit: Payer: Self-pay | Admitting: Family Medicine

## 2014-08-24 ENCOUNTER — Other Ambulatory Visit: Payer: Self-pay | Admitting: Nurse Practitioner

## 2014-10-10 ENCOUNTER — Other Ambulatory Visit: Payer: Self-pay | Admitting: Family Medicine

## 2014-10-13 ENCOUNTER — Telehealth: Payer: Self-pay | Admitting: Family Medicine

## 2014-10-13 NOTE — Telephone Encounter (Signed)
Patients last labs were done 10-22-13. Please advise on 90 day refills

## 2014-10-25 ENCOUNTER — Other Ambulatory Visit: Payer: Self-pay | Admitting: Family Medicine

## 2014-11-19 ENCOUNTER — Ambulatory Visit (INDEPENDENT_AMBULATORY_CARE_PROVIDER_SITE_OTHER): Payer: BC Managed Care – PPO | Admitting: Family Medicine

## 2014-11-19 ENCOUNTER — Encounter: Payer: Self-pay | Admitting: Family Medicine

## 2014-11-19 VITALS — BP 160/84 | HR 83 | Temp 98.8°F | Ht 65.0 in | Wt 154.0 lb

## 2014-11-19 DIAGNOSIS — J069 Acute upper respiratory infection, unspecified: Secondary | ICD-10-CM

## 2014-11-19 MED ORDER — AZITHROMYCIN 250 MG PO TABS: ORAL_TABLET | ORAL | Status: DC

## 2014-11-19 MED ORDER — METHYLPREDNISOLONE ACETATE 80 MG/ML IJ SUSP
80.0000 mg | Freq: Once | INTRAMUSCULAR | Status: AC
Start: 2014-11-19 — End: 2014-11-19
  Administered 2014-11-19: 80 mg via INTRAMUSCULAR

## 2014-11-19 NOTE — Progress Notes (Signed)
   Subjective:    Patient ID: [name] [name], male    DOB: 09/30/1946, 68 y.o.   MRN: [id number]  HPI C/o uri sx's  Review of Systems  Constitutional: Negative for fever.  HENT: Negative for ear pain.   Eyes: Negative for discharge.  Respiratory: Negative for cough.   Cardiovascular: Negative for chest pain.  Gastrointestinal: Negative for abdominal distention.  Endocrine: Negative for polyuria.  Genitourinary: Negative for difficulty urinating.  Musculoskeletal: Negative for gait problem and neck pain.  Skin: Negative for color change and rash.  Neurological: Negative for speech difficulty and headaches.  Psychiatric/Behavioral: Negative for agitation.       Objective:    BP 160/84 mmHg  Pulse 83  Temp(Src) 98.8 F (37.1 C) (Oral)  Ht 5\' 5"  (1.651 m)  Wt 154 lb (69.854 kg)  BMI 25.63 kg/m2   Physical Exam  Constitutional: He is oriented to person, place, and time. He appears well-developed and well-nourished.  HENT:  Head: Normocephalic and atraumatic.  Mouth/Throat: Oropharynx is clear and moist.  Eyes: Pupils are equal, round, and reactive to light.  Neck: Normal range of motion. Neck supple.  Cardiovascular: Normal rate and regular rhythm.   No murmur heard. Pulmonary/Chest: Effort normal and breath sounds normal.  Abdominal: Soft. Bowel sounds are normal. There is no tenderness.  Neurological: He is alert and oriented to person, place, and time.  Skin: Skin is warm and dry.  Psychiatric: He has a normal mood and affect.          Assessment & Plan:     ICD-9-CM ICD-10-CM   1. URI (upper respiratory infection) 465.9 J06.9 azithromycin (ZITHROMAX) 250 MG tablet     methylPREDNISolone acetate (DEPO-MEDROL) injection 80 mg   Push po fluids, rest, tylenol and motrin otc prn as directed for fever, arthralgias, and myalgias.  Follow up prn if sx's continue or persist.  No Follow-up on file.  Lysbeth Penner FNP

## 2014-11-29 ENCOUNTER — Other Ambulatory Visit: Payer: Self-pay | Admitting: Family Medicine

## 2014-12-28 ENCOUNTER — Other Ambulatory Visit: Payer: Self-pay | Admitting: Family Medicine

## 2014-12-29 NOTE — Telephone Encounter (Signed)
Patient needs routine appt with labs.

## 2015-01-05 ENCOUNTER — Other Ambulatory Visit: Payer: Self-pay | Admitting: Family Medicine

## 2015-01-05 MED ORDER — LISINOPRIL 10 MG PO TABS: 10.0000 mg | ORAL_TABLET | Freq: Every day | ORAL | Status: DC

## 2015-01-05 MED ORDER — SIMVASTATIN 20 MG PO TABS: 20.0000 mg | ORAL_TABLET | Freq: Every day | ORAL | Status: DC

## 2015-01-05 MED ORDER — CARVEDILOL 3.125 MG PO TABS: 3.1250 mg | ORAL_TABLET | Freq: Two times a day (BID) | ORAL | Status: DC

## 2015-01-05 MED ORDER — LEVOTHYROXINE SODIUM 50 MCG PO TABS: 50.0000 ug | ORAL_TABLET | Freq: Every day | ORAL | Status: DC

## 2015-01-05 NOTE — Telephone Encounter (Signed)
done

## 2015-01-06 ENCOUNTER — Other Ambulatory Visit: Payer: Self-pay | Admitting: Family Medicine

## 2015-01-12 ENCOUNTER — Encounter: Payer: Self-pay | Admitting: Nurse Practitioner

## 2015-01-12 ENCOUNTER — Ambulatory Visit (INDEPENDENT_AMBULATORY_CARE_PROVIDER_SITE_OTHER): Payer: BLUE CROSS/BLUE SHIELD | Admitting: Nurse Practitioner

## 2015-01-12 VITALS — BP 126/80 | HR 66 | Temp 98.7°F | Ht 65.0 in | Wt 152.5 lb

## 2015-01-12 DIAGNOSIS — Z125 Encounter for screening for malignant neoplasm of prostate: Secondary | ICD-10-CM

## 2015-01-12 DIAGNOSIS — E782 Mixed hyperlipidemia: Secondary | ICD-10-CM

## 2015-01-12 DIAGNOSIS — I10 Essential (primary) hypertension: Secondary | ICD-10-CM

## 2015-01-12 NOTE — Patient Instructions (Signed)

## 2015-01-12 NOTE — Progress Notes (Signed)
   Subjective:    Patient ID: [name] [name], male    DOB: 01-Aug-1946, 69 y.o.   MRN: [id number]   Patient is here today for chronic disease follow. No acute complaints.   Hyperlipidemia This is a chronic problem. The current episode started more than 1 year ago. The problem is controlled. He has no history of diabetes, hypothyroidism or obesity. Current antihyperlipidemic treatment includes statins. The current treatment provides moderate improvement of lipids. Compliance problems include adherence to diet and adherence to exercise.  Risk factors for coronary artery disease include dyslipidemia, hypertension and male sex.  Hypertension This is a chronic problem. The current episode started more than 1 year ago. The problem is unchanged. The problem is controlled. There are no associated agents to hypertension. Risk factors for coronary artery disease include dyslipidemia and male gender. Past treatments include ACE inhibitors. Compliance problems include diet.   Hypothyroidism:  Pt taking levothyroxine 5omcg daily. No complaint of fatigue.  CAD: Pt reports seeing a cardiologist Dr. Willey Blade, last visit over a year ago.    Review of Systems  Constitutional: Negative.   HENT: Negative.   Eyes: Negative.   Respiratory: Negative.   Cardiovascular: Negative.   Gastrointestinal: Negative.   Endocrine: Negative.   Genitourinary: Negative.   Musculoskeletal: Negative.   Skin: Negative.   Allergic/Immunologic: Negative.   Neurological: Negative.   Hematological: Negative.   Psychiatric/Behavioral: Negative.        Objective:   Physical Exam  Constitutional: He is oriented to person, place, and time. He appears well-developed and well-nourished.  HENT:  Head: Normocephalic.  Eyes: Pupils are equal, round, and reactive to light.  Neck: Normal range of motion.  Cardiovascular: Normal rate, regular rhythm and normal heart sounds.  Exam reveals no gallop.   No murmur  heard. Pulmonary/Chest: Effort normal and breath sounds normal. He has no wheezes.  Abdominal: Soft. Bowel sounds are normal.  Musculoskeletal: Normal range of motion. He exhibits no edema or tenderness.  Neurological: He is alert and oriented to person, place, and time.  Skin: Skin is warm.  Psychiatric: He has a normal mood and affect. His behavior is normal.   BP 126/80 mmHg  Pulse 66  Temp(Src) 98.7 F (37.1 C) (Oral)  Ht _0  (1.651 m)  Wt 152 lb 8 oz (69.174 kg)  BMI 25.38 kg/m2      Assessment & Plan:  1. HYPERLIPIDEMIA, MIXED Low fat diet - NMR, lipoprofile  2. Essential hypertension Do not add slat to diet - CMP14+EGFR  3. Prostate cancer screening - PSA, total and free    Labs pending Health maintenance reviewed Diet and exercise encouraged Continue all meds Follow up  In 3 months   [place], FNP

## 2015-01-13 LAB — CMP14+EGFR
ALT: 29 IU/L (ref 0–44)
AST: 25 IU/L (ref 0–40)
Albumin/Globulin Ratio: 1.8 (ref 1.1–2.5)
Albumin: 4.3 g/dL (ref 3.6–4.8)
Alkaline Phosphatase: 57 IU/L (ref 39–117)
BILIRUBIN TOTAL: 0.6 mg/dL (ref 0.0–1.2)
BUN/Creatinine Ratio: 14 (ref 10–22)
BUN: 11 mg/dL (ref 8–27)
CALCIUM: 8.9 mg/dL (ref 8.6–10.2)
CO2: 27 mmol/L (ref 18–29)
Chloride: 101 mmol/L (ref 97–108)
Creatinine, Ser: 0.79 mg/dL (ref 0.76–1.27)
GFR calc Af Amer: 108 mL/min/{1.73_m2} (ref >59)
GFR calc non Af Amer: 94 mL/min/{1.73_m2} (ref >59)
GLOBULIN, TOTAL: 2.4 g/dL (ref 1.5–4.5)
GLUCOSE: 87 mg/dL (ref 65–99)
POTASSIUM: 4.3 mmol/L (ref 3.5–5.2)
Sodium: 140 mmol/L (ref 134–144)
Total Protein: 6.7 g/dL (ref 6.0–8.5)

## 2015-01-13 LAB — NMR, LIPOPROFILE
CHOLESTEROL: 115 mg/dL (ref 100–199)
HDL CHOLESTEROL BY NMR: 49 mg/dL (ref >39)
HDL Particle Number: 35 umol/L (ref >=30.5)
LDL PARTICLE NUMBER: 554 nmol/L (ref <1000)
LDL Size: 20.4 nm (ref >20.5)
LDL-C: 49 mg/dL (ref 0–99)
LP-IR Score: 45 (ref <=45)
SMALL LDL PARTICLE NUMBER: 271 nmol/L (ref <=527)
Triglycerides by NMR: 85 mg/dL (ref 0–149)

## 2015-01-13 LAB — PSA, TOTAL AND FREE
PSA FREE: 0.01 ng/mL
PSA, Free Pct: >10 %

## 2015-01-14 ENCOUNTER — Encounter: Payer: Self-pay | Admitting: Nurse Practitioner

## 2015-01-15 ENCOUNTER — Other Ambulatory Visit: Payer: BLUE CROSS/BLUE SHIELD

## 2015-01-15 DIAGNOSIS — Z1212 Encounter for screening for malignant neoplasm of rectum: Secondary | ICD-10-CM

## 2015-01-15 NOTE — Progress Notes (Signed)
Lab only 

## 2015-01-19 LAB — FECAL OCCULT BLOOD, IMMUNOCHEMICAL: FECAL OCCULT BLD: NEGATIVE

## 2015-04-03 ENCOUNTER — Other Ambulatory Visit: Payer: Self-pay | Admitting: Family Medicine

## 2015-04-06 ENCOUNTER — Other Ambulatory Visit: Payer: Self-pay | Admitting: Family Medicine

## 2015-04-06 NOTE — Telephone Encounter (Signed)
Please advise on refill- last seen 01/12/15- do not see a TSH from the past year.

## 2015-04-13 ENCOUNTER — Encounter: Payer: Self-pay | Admitting: Gastroenterology

## 2015-05-12 ENCOUNTER — Encounter: Payer: Self-pay | Admitting: Family Medicine

## 2015-05-12 ENCOUNTER — Ambulatory Visit (INDEPENDENT_AMBULATORY_CARE_PROVIDER_SITE_OTHER): Payer: BLUE CROSS/BLUE SHIELD | Admitting: Family Medicine

## 2015-05-12 VITALS — BP 147/85 | HR 64 | Temp 97.1°F | Ht 65.0 in | Wt 158.0 lb

## 2015-05-12 DIAGNOSIS — R300 Dysuria: Secondary | ICD-10-CM | POA: Diagnosis not present

## 2015-05-12 DIAGNOSIS — M545 Low back pain, unspecified: Secondary | ICD-10-CM

## 2015-05-12 LAB — POCT URINALYSIS DIPSTICK
Bilirubin, UA: NEGATIVE
GLUCOSE UA: NEGATIVE
Ketones, UA: NEGATIVE
Leukocytes, UA: NEGATIVE
Nitrite, UA: NEGATIVE
Protein, UA: NEGATIVE
Spec Grav, UA: 1.015
UROBILINOGEN UA: NEGATIVE
pH, UA: 6.5

## 2015-05-12 LAB — POCT UA - MICROSCOPIC ONLY
Bacteria, U Microscopic: NEGATIVE
CRYSTALS, UR, HPF, POC: NEGATIVE
Casts, Ur, LPF, POC: NEGATIVE
EPITHELIAL CELLS, URINE PER MICROSCOPY: NEGATIVE
Mucus, UA: NEGATIVE
RBC, URINE, MICROSCOPIC: NEGATIVE
YEAST UA: NEGATIVE

## 2015-05-12 NOTE — Progress Notes (Signed)
   Subjective:    Patient ID: [name] [name], male    DOB: [birth year], 69 y.o.   MRN: [id number]  HPI  69 year old gentleman with urinary frequency. He's had one recent episode of dysuria but also has back pain seems to be more in the flank area than central in the spinal area. There is a past history of prostate cancer with prostatectomy. He saw Dr. ran urologist, last year who wanted to do some sort of bladder and kidney scan but because of resources patient did not follow through. He tells me that he does have a family history of hematuria and that he always has some blood cells in his urine    Review of Systems  Constitutional: Negative.   Respiratory: Negative.   Cardiovascular: Negative.   Gastrointestinal: Negative.   Genitourinary: Positive for dysuria.  Musculoskeletal: Positive for back pain.   Patient Active Problem List   Diagnosis Date Noted  . CAD (coronary artery disease)   . HTN (hypertension)   . HYPERLIPIDEMIA, MIXED 07/21/2009   Outpatient Encounter Prescriptions as of 05/12/2015  Medication Sig  . aspirin 325 MG tablet Take 325 mg by mouth daily.  . carvedilol (COREG) 3.125 MG tablet TAKE 1 TABLET (3.125 MG TOTAL) BY MOUTH 2 (TWO) TIMES DAILY.  . fluticasone (FLONASE) 50 MCG/ACT nasal spray USE 2 SPRAYS INTO BOTH NOSTRILS DAILY.  Marland Kitchen levothyroxine (SYNTHROID, LEVOTHROID) 50 MCG tablet TAKE 1 TABLET (50 MCG TOTAL) BY MOUTH DAILY.  Marland Kitchen lisinopril (PRINIVIL,ZESTRIL) 10 MG tablet TAKE 1 TABLET (10 MG TOTAL) BY MOUTH DAILY.  . simvastatin (ZOCOR) 20 MG tablet TAKE 1 TABLET (20 MG TOTAL) BY MOUTH AT BEDTIME.   No facility-administered encounter medications on file as of 05/12/2015.       Objective:   Physical Exam  Constitutional: He appears well-developed and well-nourished.  Cardiovascular: Normal rate and regular rhythm.   Pulmonary/Chest: Effort normal.  Abdominal: Soft.  Musculoskeletal:  Decreased range of motion in his back especially flexion. There is no  tenderness to percussion over the spine. Straight leg raising is negative. Deep tendon reflexes are symmetric.          Assessment & Plan:  1. Dysuria Urine does not appear infected but will obtain culture. I suspect the blood cells are chronic per his history - POCT UA - Microscopic Only - POCT urinalysis dipstick - Urine culture  2. Bilateral low back pain without sciatica Begin, with history of prostate cancer a concern as always for recurrence especially in bone. I'm hopeful that basic labs looking calcium and I can't phosphatase may provide some clue as to etiology of his pain  Wardell Honour MD - [phone]

## 2015-05-13 LAB — CMP14+EGFR
A/G RATIO: 2 (ref 1.1–2.5)
ALK PHOS: 61 IU/L (ref 39–117)
ALT: 19 IU/L (ref 0–44)
AST: 22 IU/L (ref 0–40)
Albumin: 4.4 g/dL (ref 3.6–4.8)
BILIRUBIN TOTAL: 0.3 mg/dL (ref 0.0–1.2)
BUN / CREAT RATIO: 13 (ref 10–22)
BUN: 10 mg/dL (ref 8–27)
CO2: 26 mmol/L (ref 18–29)
Calcium: 8.7 mg/dL (ref 8.6–10.2)
Chloride: 102 mmol/L (ref 97–108)
Creatinine, Ser: 0.76 mg/dL (ref 0.76–1.27)
GFR, EST AFRICAN AMERICAN: 110 mL/min/{1.73_m2} (ref >59)
GFR, EST NON AFRICAN AMERICAN: 95 mL/min/{1.73_m2} (ref >59)
GLUCOSE: 100 mg/dL — AB (ref 65–99)
Globulin, Total: 2.2 g/dL (ref 1.5–4.5)
Potassium: 4.2 mmol/L (ref 3.5–5.2)
Sodium: 140 mmol/L (ref 134–144)
Total Protein: 6.6 g/dL (ref 6.0–8.5)

## 2015-05-15 LAB — URINE CULTURE

## 2015-05-29 ENCOUNTER — Other Ambulatory Visit: Payer: Self-pay | Admitting: Family Medicine

## 2015-06-02 ENCOUNTER — Other Ambulatory Visit: Payer: Self-pay

## 2015-06-02 MED ORDER — FLUTICASONE PROPIONATE 50 MCG/ACT NA SUSP: NASAL | Status: AC

## 2015-07-02 ENCOUNTER — Other Ambulatory Visit: Payer: Self-pay | Admitting: Nurse Practitioner

## 2015-07-03 NOTE — Telephone Encounter (Signed)
no more refills without being seen  

## 2015-07-03 NOTE — Telephone Encounter (Signed)
Detailed message left for patient that he will need to be seen for any further refills

## 2015-07-03 NOTE — Telephone Encounter (Signed)
No TSH in epic

## 2015-07-27 ENCOUNTER — Telehealth: Payer: Self-pay | Admitting: *Deleted

## 2015-07-27 NOTE — Telephone Encounter (Signed)
lmtcb to schedule appt for 3 month follow up with MMM and to recheck BP.

## 2015-09-12 DEATH — deceased

## 2015-09-25 ENCOUNTER — Other Ambulatory Visit: Payer: Self-pay | Admitting: Nurse Practitioner
